# Patient Record
Sex: Female | Born: 1960 | Race: White | Hispanic: No | Marital: Single | Smoking: Former smoker
Health system: Southern US, Community
[De-identification: ages and names within clinical notes are randomized; demographics above are authoritative.]

## PROBLEM LIST (undated history)

## (undated) DIAGNOSIS — G4730 Sleep apnea, unspecified: Secondary | ICD-10-CM

## (undated) DIAGNOSIS — M545 Low back pain, unspecified: Secondary | ICD-10-CM

## (undated) DIAGNOSIS — F32A Depression, unspecified: Secondary | ICD-10-CM

## (undated) DIAGNOSIS — F909 Attention-deficit hyperactivity disorder, unspecified type: Secondary | ICD-10-CM

## (undated) DIAGNOSIS — D649 Anemia, unspecified: Secondary | ICD-10-CM

## (undated) DIAGNOSIS — Z8659 Personal history of other mental and behavioral disorders: Secondary | ICD-10-CM

## (undated) DIAGNOSIS — F411 Generalized anxiety disorder: Secondary | ICD-10-CM

## (undated) DIAGNOSIS — R519 Headache, unspecified: Secondary | ICD-10-CM

## (undated) DIAGNOSIS — K219 Gastro-esophageal reflux disease without esophagitis: Secondary | ICD-10-CM

## (undated) HISTORY — DX: Gastro-esophageal reflux disease without esophagitis: K21.9

## (undated) HISTORY — PX: COLONOSCOPY: SHX174

## (undated) HISTORY — DX: Low back pain, unspecified: M54.50

## (undated) HISTORY — DX: Attention-deficit hyperactivity disorder, unspecified type: F90.9

## (undated) HISTORY — DX: Low back pain: M54.5

## (undated) HISTORY — PX: WISDOM TOOTH EXTRACTION: SHX21

## (undated) HISTORY — DX: Generalized anxiety disorder: F41.1

---

## 1998-03-31 ENCOUNTER — Other Ambulatory Visit: Admission: RE | Admit: 1998-03-31 | Discharge: 1998-03-31 | Payer: Self-pay | Admitting: Obstetrics & Gynecology

## 1999-12-12 ENCOUNTER — Other Ambulatory Visit: Admission: RE | Admit: 1999-12-12 | Discharge: 1999-12-12 | Payer: Self-pay | Admitting: Obstetrics and Gynecology

## 2001-01-17 ENCOUNTER — Other Ambulatory Visit: Admission: RE | Admit: 2001-01-17 | Discharge: 2001-01-17 | Payer: Self-pay | Admitting: Obstetrics and Gynecology

## 2010-07-22 ENCOUNTER — Inpatient Hospital Stay (INDEPENDENT_AMBULATORY_CARE_PROVIDER_SITE_OTHER)
Admission: RE | Admit: 2010-07-22 | Discharge: 2010-07-22 | Disposition: A | Discharge status: Home / Self Care | Payer: BC Managed Care – PPO | Source: Ambulatory Visit | Attending: Family Medicine | Admitting: Family Medicine

## 2010-07-22 ENCOUNTER — Ambulatory Visit (HOSPITAL_COMMUNITY): Payer: Self-pay

## 2010-07-22 DIAGNOSIS — J069 Acute upper respiratory infection, unspecified: Secondary | ICD-10-CM

## 2012-11-04 ENCOUNTER — Other Ambulatory Visit: Payer: Self-pay | Admitting: Family Medicine

## 2012-11-04 DIAGNOSIS — Z1231 Encounter for screening mammogram for malignant neoplasm of breast: Secondary | ICD-10-CM

## 2012-11-06 ENCOUNTER — Ambulatory Visit
Admission: RE | Admit: 2012-11-06 | Discharge: 2012-11-06 | Disposition: A | Discharge status: Home / Self Care | Payer: BC Managed Care – PPO | Source: Ambulatory Visit | Attending: Family Medicine | Admitting: Family Medicine

## 2012-11-06 DIAGNOSIS — Z1231 Encounter for screening mammogram for malignant neoplasm of breast: Secondary | ICD-10-CM

## 2013-10-01 ENCOUNTER — Other Ambulatory Visit: Payer: Self-pay | Admitting: Family Medicine

## 2013-10-01 DIAGNOSIS — Z1231 Encounter for screening mammogram for malignant neoplasm of breast: Secondary | ICD-10-CM

## 2013-10-01 DIAGNOSIS — M1990 Unspecified osteoarthritis, unspecified site: Secondary | ICD-10-CM

## 2013-10-02 ENCOUNTER — Other Ambulatory Visit: Payer: Self-pay | Admitting: Family Medicine

## 2013-10-02 DIAGNOSIS — M8580 Other specified disorders of bone density and structure, unspecified site: Secondary | ICD-10-CM

## 2013-11-07 ENCOUNTER — Ambulatory Visit
Admission: RE | Admit: 2013-11-07 | Discharge: 2013-11-07 | Disposition: A | Discharge status: Home / Self Care | Payer: BC Managed Care – PPO | Source: Ambulatory Visit | Attending: Family Medicine | Admitting: Family Medicine

## 2013-11-07 DIAGNOSIS — Z1231 Encounter for screening mammogram for malignant neoplasm of breast: Secondary | ICD-10-CM

## 2013-11-07 DIAGNOSIS — M8580 Other specified disorders of bone density and structure, unspecified site: Secondary | ICD-10-CM

## 2016-10-08 ENCOUNTER — Encounter (HOSPITAL_COMMUNITY): Payer: Self-pay | Admitting: Emergency Medicine

## 2016-10-08 ENCOUNTER — Ambulatory Visit (HOSPITAL_COMMUNITY)
Admission: EM | Admit: 2016-10-08 | Discharge: 2016-10-08 | Disposition: A | Discharge status: Home / Self Care | Payer: BC Managed Care – PPO | Attending: Family Medicine | Admitting: Family Medicine

## 2016-10-08 DIAGNOSIS — S80861A Insect bite (nonvenomous), right lower leg, initial encounter: Secondary | ICD-10-CM

## 2016-10-08 DIAGNOSIS — W57XXXA Bitten or stung by nonvenomous insect and other nonvenomous arthropods, initial encounter: Secondary | ICD-10-CM

## 2016-10-08 NOTE — ED Triage Notes (Signed)
The patient presented to the [facility] with a complaint of a possible insect bite to the back of her right leg that she noticed today.

## 2016-10-09 NOTE — ED Provider Notes (Signed)
  [facility]   [id number] 10/08/16 Arrival Time: 1905  ASSESSMENT & PLAN:  Today you were diagnosed with the following: 1. Insect bite, initial encounter    Observation only.  Written information provided on tick bites.  If you are not improving over the next few days or feel you are worsening please follow up here or the Emergency Department if you are unable to see your regular doctor.   Reviewed expectations re: course of current medical issues. Questions answered. Outlined signs and symptoms indicating need for more acute intervention. Patient verbalized understanding. After Visit Summary given.   SUBJECTIVE:  Jade Cabrera is a 56 y.o. female who presents with complaint of possible insect bite to back of R leg. Small area of redness. Noticed this afternoon. Nonpainful. Has not enlarged. No drainage or bleeding. Afebrile. No abdominal pain or HA. No other rashes. Ambulatory.  ROS: As per HPI.   OBJECTIVE:  Vitals:   10/08/16 1932  BP: 110/64  Pulse: 86  Resp: 18  Temp: 98.2 F (36.8 C)  TempSrc: Oral  SpO2: 100%     General appearance: alert; no distress Extremities: no cyanosis or edema; symmetrical with no gross deformities Skin: warm and dry; post R thigh with 3 mm area of circular erythema; no significant induration; non-tender; no foreign body seen  No Known Allergies  PMHx, SurgHx, SocialHx, Medications, and Allergies were reviewed in the Visit Navigator and updated as appropriate.      Mardella LaymanHagler, Andrey Hoobler, MD 10/09/16 [phone]

## 2017-02-15 ENCOUNTER — Other Ambulatory Visit: Payer: Self-pay | Admitting: Family Medicine

## 2017-02-15 DIAGNOSIS — M810 Age-related osteoporosis without current pathological fracture: Secondary | ICD-10-CM

## 2017-02-15 DIAGNOSIS — Z1231 Encounter for screening mammogram for malignant neoplasm of breast: Secondary | ICD-10-CM

## 2017-08-28 ENCOUNTER — Encounter: Payer: Self-pay | Admitting: Neurology

## 2017-08-28 ENCOUNTER — Ambulatory Visit: Payer: BC Managed Care – PPO | Admitting: Neurology

## 2017-08-28 VITALS — BP 117/63 | HR 74 | Ht 64.0 in | Wt 140.0 lb

## 2017-08-28 DIAGNOSIS — R0681 Apnea, not elsewhere classified: Secondary | ICD-10-CM | POA: Diagnosis not present

## 2017-08-28 DIAGNOSIS — Z820 Family history of epilepsy and other diseases of the nervous system: Secondary | ICD-10-CM

## 2017-08-28 DIAGNOSIS — R0683 Snoring: Secondary | ICD-10-CM | POA: Diagnosis not present

## 2017-08-28 DIAGNOSIS — R400 Somnolence: Secondary | ICD-10-CM | POA: Diagnosis not present

## 2017-08-28 DIAGNOSIS — R351 Nocturia: Secondary | ICD-10-CM

## 2017-08-28 DIAGNOSIS — R51 Headache: Secondary | ICD-10-CM

## 2017-08-28 DIAGNOSIS — R519 Headache, unspecified: Secondary | ICD-10-CM

## 2017-08-28 NOTE — Patient Instructions (Signed)

## 2017-08-28 NOTE — Progress Notes (Signed)
Subjective:    Patient ID: Jade Cabrera is a 57 y.o. female.  HPI     Huston FolSHARAN MCENANEY [facility] [address] [address] [place], [place] [address]  Dear Jade Cabrera,   I saw your patient, Jade Cabrera, upon your kind request, in my neurologic clinic today for initial consultation of her sleep disorder, in particular, and for underlying obstructive sleep apnea. The patient is unaccompanied today. As you know, Jade Cabrera is a 57 year old right-handed woman with an underlying medical history of allergic rhinitis, anxiety, reflux disease and low back pain, who reports snoring and excessive daytime somnolence. I reviewed your office note from 07/04/2017, which you kindly included. Her husband has reported that she has breathing pauses while she is asleep. Her Epworth sleepiness score is 16 out of 24, fatigue score is 41 out of 63. She does not sleep well and does not wake up rested. She is married and lives with her husband her son and her mother-in-law. She has 2 children. She works for [employer], [employer], broadcasting/film/video. She quit smoking in the 1990s and does not currently utilize alcohol in any regularity. She drinks caffeine in the form of coffee, 2 cups in the morning typically. She has a family history of sleep apnea affecting her mother and her brother, both have a CPAP machine. She has been sleepy since this degree in the past year. Of note, she also restarted her Lexapro about a year ago. She was off of it for a year or 2 and had also stopped taking her Ritalin for ADD. She is no longer on Ritalin and is on Lexapro 5 mg strength once daily. She has been able to maintain her weight. In the past she was able to lose about 50 pounds and has been able to keep it off. Her bedtime is around 9, right time between 5:45 AM and late as 6:30 AM. She does have significant nocturia about 2-3 times per average night and has had the occasional morning headache, does no  longer suffer from migraines thankfully.she denies any palpitations symptoms of restless leg syndrome.  Her Past Medical History Is Significant For: Past Medical History:  Diagnosis Date  . ADHD   . GAD (generalized anxiety disorder)   . GERD without esophagitis   . Low back pain     Her Past Surgical History Is Significant For: No past surgical history on file.  Her Family History Is Significant For: No family history on file.  Her Social History Is Significant For: Social History   Socioeconomic History  . Marital status: Single    Spouse name: Not on file  . Number of children: Not on file  . Years of education: Not on file  . Highest education level: Not on file  Occupational History  . Not on file  Social Needs  . Financial resource strain: Not on file  . Food insecurity:    Worry: Not on file    Inability: Not on file  . Transportation needs:    Medical: Not on file    Non-medical: Not on file  Tobacco Use  . Smoking status: Never Smoker  . Smokeless tobacco: Never Used  Substance and Sexual Activity  . Alcohol use: No  . Drug use: No  . Sexual activity: Not on file  Lifestyle  . Physical activity:    Days per week: Not on file    Minutes per session: Not on file  . Stress: Not  on file  Relationships  . Social connections:    Talks on phone: Not on file    Gets together: Not on file    Attends religious service: Not on file    Active member of club or organization: Not on file    Attends meetings of clubs or organizations: Not on file    Relationship status: Not on file  Other Topics Concern  . Not on file  Social History Narrative  . Not on file    Her Allergies Are:  No Known Allergies:   Her Current Medications Are:  Outpatient Encounter Medications as of 08/28/2017  Medication Sig  . azelastine (ASTELIN) 0.1 % nasal spray Place 2 sprays into both nostrils 2 (two) times daily. Use in each nostril as directed  . escitalopram (LEXAPRO) 5 MG  tablet Take 5 mg by mouth daily.  Marland Kitchen loratadine (CLARITIN) 10 MG tablet Take 10 mg by mouth daily.   No facility-administered encounter medications on file as of 08/28/2017.   :  Review of Systems:  Out of a complete 14 point review of systems, all are reviewed and negative with the exception of these symptoms as listed below: Review of Systems  Neurological:       Pt presents today to discuss her sleep. Pt has never had a sleep study but does endorse snoring.  Epworth Sleepiness Scale 0= would never doze 1= slight chance of dozing 2= moderate chance of dozing 3= high chance of dozing  Sitting and reading: 3 Watching TV: 2 Sitting inactive in a public place (ex. Theater or meeting): 3 As a passenger in a car for an hour without a break: 1 Lying down to rest in the afternoon: 3 Sitting and talking to someone: 2 Sitting quietly after lunch (no alcohol): 2 In a car, while stopped in traffic: 0 Total: 16     Objective:  Neurological Exam  Physical Exam Physical Examination:   Vitals:   08/28/17 1559  BP: 117/63  Pulse: 74    General Examination: The patient is a very pleasant 57 y.o. female in no acute distress. She appears well-developed and well-nourished and well groomed.   HEENT: Normocephalic, atraumatic, pupils are equal, round and reactive to light and accommodation. Funduscopic exam is normal with sharp disc margins noted. Extraocular tracking is good without limitation to gaze excursion or nystagmus noted. Normal smooth pursuit is noted. Hearing is grossly intact. Tympanic membranes are clear bilaterally. Face is symmetric with normal facial animation and normal facial sensation. Speech is clear with no dysarthria noted. There is no hypophonia. There is no lip, neck/head, jaw or voice tremor. Neck is supple with full range of passive and active motion. There are no carotid bruits on auscultation. Oropharynx exam reveals: mild mouth dryness, adequate dental hygiene and  mild airway crowding, due to redundant soft palate. Mallampati is class II. Tongue protrudes centrally and palate elevates symmetrically. Tonsils are small. Neck size is 13 3/8 inches. She has a Moderate overbite.   Chest: Clear to auscultation without wheezing, rhonchi or crackles noted.  Heart: S1+S2+0, regular and normal without murmurs, rubs or gallops noted.   Abdomen: Soft, non-tender and non-distended with normal bowel sounds appreciated on auscultation.  Extremities: There is no pitting edema in the distal lower extremities bilaterally. Pedal pulses are intact.  Skin: Warm and dry without trophic changes noted.  Musculoskeletal: exam reveals no obvious joint deformities, tenderness or joint swelling or erythema.   Neurologically:  Mental status: The patient is  awake, alert and oriented in all 4 spheres. Her immediate and remote memory, attention, language skills and fund of knowledge are appropriate. There is no evidence of aphasia, agnosia, apraxia or anomia. Speech is clear with normal prosody and enunciation. Thought process is linear. Mood is normal and affect is normal.  Cranial nerves II - XII are as described above under HEENT exam. In addition: shoulder shrug is normal with equal shoulder height noted. Motor exam: Normal bulk, strength and tone is noted. There is no drift, tremor or rebound. Romberg is negative. Reflexes are 2+ throughout. Fine motor skills and coordination: intact with normal finger taps, normal hand movements, normal rapid alternating patting, normal foot taps and normal foot agility.  Cerebellar testing: No dysmetria or intention tremor. There is no truncal or gait ataxia.  Sensory exam: intact to light touch in the upper and lower extremities.  Gait, station and balance: She stands easily. No veering to one side is noted. No leaning to one side is noted. Posture is age-appropriate and stance is narrow based. Gait shows normal stride length and normal pace. No  problems turning are noted. Tandem walk is unremarkable.     Assessment and Plan:   In summary, FATOU DUNNIGAN is a very pleasant 57 y.o.-year old female with an underlying medical history of allergic rhinitis, anxiety, reflux disease and low back pain, whose history and physical exam concerning for obstructive sleep apnea (OSA). I had a long chat with the patient about my findings and the diagnosis of OSA, its prognosis and treatment options. We talked about medical treatments, surgical interventions and non-pharmacological approaches. I explained in particular the risks and ramifications of untreated moderate to severe OSA, especially with respect to developing cardiovascular disease down the Road, including congestive heart failure, difficult to treat hypertension, cardiac arrhythmias, or stroke. Even type 2 diabetes has, in part, been linked to untreated OSA. Symptoms of untreated OSA include daytime sleepiness, memory problems, mood irritability and mood disorder such as depression and anxiety, lack of energy, as well as recurrent headaches, especially morning headaches. We talked about trying to maintain a healthy lifestyle in general, as well as the importance of weight control. I encouraged the patient to eat healthy, exercise daily and keep well hydrated, to keep a scheduled bedtime and wake time routine, to not skip any meals and eat healthy snacks in between meals. I advised the patient not to drive when feeling sleepy. I recommended the following at this time: sleep study with potential positive airway pressure titration. (We will score hypopneas at 3%).   I explained the sleep test procedure to the patient and also outlined possible surgical and non-surgical treatment options of OSA, including the use of a custom-made dental device (which would require a referral to a specialist dentist or oral surgeon), upper airway surgical options, such as pillar implants, radiofrequency surgery, tongue base  surgery, and UPPP (which would involve a referral to an ENT surgeon). Rarely, jaw surgery such as mandibular advancement may be considered.  I also explained the CPAP treatment option to the patient, who indicated that she would be willing to try CPAP if the need arises. I explained the importance of being compliant with PAP treatment, not only for insurance purposes but primarily to improve Her symptoms, and for the patient's long term health benefit, including to reduce Her cardiovascular risks. I answered all her questions today and the patient was in agreement. I would like to see her back after the sleep study is  completed and encouraged her to call with any interim questions, concerns, problems or updates.   Thank you very much for allowing me to participate in the care of this nice patient. If I can be of any further assistance to you please do not hesitate to call me at [phone].  Sincerely,   Star Age, MD, PhD

## 2017-09-30 ENCOUNTER — Ambulatory Visit (INDEPENDENT_AMBULATORY_CARE_PROVIDER_SITE_OTHER): Payer: BC Managed Care – PPO | Admitting: Neurology

## 2017-09-30 DIAGNOSIS — Z820 Family history of epilepsy and other diseases of the nervous system: Secondary | ICD-10-CM

## 2017-09-30 DIAGNOSIS — R0683 Snoring: Secondary | ICD-10-CM

## 2017-09-30 DIAGNOSIS — R351 Nocturia: Secondary | ICD-10-CM

## 2017-09-30 DIAGNOSIS — R400 Somnolence: Secondary | ICD-10-CM

## 2017-09-30 DIAGNOSIS — R51 Headache: Secondary | ICD-10-CM

## 2017-09-30 DIAGNOSIS — R519 Headache, unspecified: Secondary | ICD-10-CM

## 2017-09-30 DIAGNOSIS — R0681 Apnea, not elsewhere classified: Secondary | ICD-10-CM

## 2017-09-30 DIAGNOSIS — G4720 Circadian rhythm sleep disorder, unspecified type: Secondary | ICD-10-CM

## 2017-09-30 DIAGNOSIS — G4733 Obstructive sleep apnea (adult) (pediatric): Secondary | ICD-10-CM | POA: Diagnosis not present

## 2017-10-09 ENCOUNTER — Telehealth: Payer: Self-pay

## 2017-10-09 NOTE — Progress Notes (Signed)
Patient referred by Dr. Duanne Guessewey, seen by me on 08/28/17, diagnostic PSG on 09/30/17.   Please call and notify the patient that the recent sleep study showed moderate to severe obstructive sleep apnea, with a total AHI of 14.6/hour, REM AHI of 44.4/hour, and O2 nadir of 74%. I recommend treatment for this in the form of CPAP. This will require a repeat sleep study for proper titration and mask fitting and correct monitoring of the oxygen saturations. Please explain to patient. I have placed an order in the chart. Thanks.  Huston FoleySaima Krosby Ritchie, MD, PhD [facility]

## 2017-10-09 NOTE — Telephone Encounter (Signed)
Pt returned my call. I advised pt that Dr. Athar reviewed their sleep study results and found that pt has moderate to severe osa and recommends that pt be treated with a cpap. Dr. Athar recommends that pt return for a repeat sleep study in order to properly titrate the cpap and ensure a good mask fit. Pt is agreeable to returning for a titration study. I advised pt that our sleep lab will file with pt's insurance and call pt to schedule the sleep study when we hear back from the pt's insurance regarding coverage of this sleep study. Pt verbalized understanding of results. Pt had no questions at this time but was encouraged to call back if questions arise.   

## 2017-10-09 NOTE — Telephone Encounter (Signed)
-----   Message from Huston FoleySaima Athar, MD sent at 10/09/2017  7:53 AM EDT ----- Patient referred by Dr. Duanne Guessewey, seen by me on 08/28/17, diagnostic PSG on 09/30/17.   Please call and notify the patient that the recent sleep study showed moderate to severe obstructive sleep apnea, with a total AHI of 14.6/hour, REM AHI of 44.4/hour, and O2 nadir of 74%. I recommend treatment for this in the form of CPAP. This will require a repeat sleep study for proper titration and mask fitting and correct monitoring of the oxygen saturations. Please explain to patient. I have placed an order in the chart. Thanks.  Huston FoleySaima Athar, MD, PhD [facility]

## 2017-10-09 NOTE — Addendum Note (Signed)
Addended by: Huston FoleyATHAR, Bonnie Roig on: 10/09/2017 07:54 AM   Modules accepted: Orders

## 2017-10-09 NOTE — Telephone Encounter (Signed)
I called pt to discuss her sleep study results, no answer, left a message asking her to call me back. 

## 2017-10-09 NOTE — Procedures (Signed)
PATIENT'S NAME:  Jade Cabrera, [name] DOB:      January 14, 1961      MR#:    [id number]     DATE OF RECORDING: 09/30/2017 REFERRING M.D.:  Maryelizabeth RowanElizabeth Dewey, MD Study Performed:   Baseline Polysomnogram HISTORY: 57 year old woman with a history of allergic rhinitis, anxiety, reflux disease and low back pain, who reports snoring and excessive daytime somnolence. Her Epworth sleepiness score is 16 out of 24. The patient's weight 141 pounds with a height of 64 (inches), resulting in a BMI of 24.1 kg/m2. The patient's neck circumference measured 13.5 inches.  CURRENT MEDICATIONS: Azelastine, Escitalopram and Loratadine.   PROCEDURE:  This is a multichannel digital polysomnogram utilizing the Somnostar 11.2 system.  Electrodes and sensors were applied and monitored per AASM Specifications.   EEG, EOG, Chin and Limb EMG, were sampled at 200 Hz.  ECG, Snore and Nasal Pressure, Thermal Airflow, Respiratory Effort, CPAP Flow and Pressure, Oximetry was sampled at 50 Hz. Digital video and audio were recorded.      BASELINE STUDY  Lights Out was at 22:26 and Lights On at 05:01.  Total recording time (TRT) was 396 minutes, with a total sleep time (TST) of  354.5 minutes.   The patient's sleep latency was 21 minutes. REM latency was 111.5 minutes.  The sleep efficiency was 89.5 %.     SLEEP ARCHITECTURE: WASO (Wake after sleep onset) was 20.5 minutes.  There were 6 minutes in Stage N1, 285 minutes Stage N2, 0 minutes Stage N3 and 63.5 minutes in Stage REM.  The percentage of Stage N1 was 1.7%, Stage N2 was 80.4%, which is markedly increased, Stage N3 was absent, and Stage R (REM sleep) was 17.9%. The arousals were noted as: 22 were spontaneous, 0 were associated with PLMs, 86 were associated with respiratory events.   RESPIRATORY ANALYSIS:  There were a total of 86 respiratory events:  22 obstructive apneas, 0 central apneas and 0 mixed apneas with a total of 22 apneas and an apnea index (AI) of 3.7 /hour. There were 64  hypopneas with a hypopnea index of 10.8 /hour. The patient also had 0 respiratory event related arousals (RERAs).      The total APNEA/HYPOPNEA INDEX (AHI) was 14.6/hour and the total RESPIRATORY DISTURBANCE INDEX was 0. 14.6 /hour.  47 events occurred in REM sleep and 46 events in NREM. The REM AHI was 44.4 /hour, versus a non-REM AHI of 8.. The patient spent 34.5 minutes of total sleep time in the supine position and 320 minutes in non-supine. The supine AHI was 3.5 versus a non-supine AHI of 15.7.  OXYGEN SATURATION & C02:  The Wake baseline 02 saturation was 94%, with the lowest being 74%. Time spent below 89% saturation equaled 48 minutes. PERIODIC LIMB MOVEMENTS: The patient had a total of 0 Periodic Limb Movements.  The Periodic Limb Movement (PLM) index was 0 and the PLM Arousal index was 0/hour.  Audio and video analysis did not show any abnormal or unusual movements, behaviors, phonations or vocalizations. The patient took 1 bathroom break. Mild to moderate snoring was noted. The EKG was in keeping with normal sinus rhythm (NSR).  Post-study, the patient indicated that sleep was better than usual.   IMPRESSION:  1. Obstructive Sleep Apnea (OSA) 2. Dysfunctions associated with sleep stages or arousals from sleep   RECOMMENDATIONS:  1. This study demonstrates moderate to severe obstructive sleep apnea, with a total AHI of 14.6/hour, REM AHI of 44.4/hour, and O2 nadir of 74%. Treatment  with positive airway pressure in the form of CPAP is recommended. This will require a full night titration study to optimize therapy. Other treatment options may include avoidance of supine sleep position along with weight loss, upper airway or jaw surgery in selected patients or the use of an oral appliance in certain patients. ENT evaluation and/or consultation with a maxillofacial surgeon or dentist may be feasible in some instances.    2. Please note that untreated obstructive sleep apnea may carry  additional perioperative morbidity. Patients with significant obstructive sleep apnea should receive perioperative PAP therapy and the surgeons and particularly the anesthesiologist should be informed of the diagnosis and the severity of the sleep disordered breathing. 3. This study shows abnormal sleep stage percentages; these are nonspecific findings and per se do not signify an intrinsic sleep disorder or a cause for the patient's sleep-related symptoms. Causes include (but are not limited to) the first night effect of the sleep study, circadian rhythm disturbances, medication effect or an underlying mood disorder or medical problem.  4. The patient should be cautioned not to drive, work at heights, or operate dangerous or heavy equipment when tired or sleepy. Review and reiteration of good sleep hygiene measures should be pursued with any patient. 5. The patient will be seen in follow-up in the sleep clinic at [facility] for discussion of the test results, symptom and treatment compliance review, further management strategies, etc. The referring provider will be notified of the test results.  I certify that I have reviewed the entire raw data recording prior to the issuance of this report in accordance with the Standards of Accreditation of the American Academy of Sleep Medicine (AASM)   Huston Foley, MD, PhD Diplomat, American Board of Neurology and Sleep Medicine (Neurology and Sleep Medicine)

## 2017-10-17 NOTE — Telephone Encounter (Signed)
Pt called in, asked to speak with me. I was able to take the call. She wanted to confirm that her O2 nadir was 74%. She also wanted to make sure Dr. Duanne Guessewey was sent a copy of the results. I advised her that I will re-fax the results again today, which I just completed. Pt has scheduled her titration study for tomorrow. Pt verbalized understanding of results.

## 2017-10-18 ENCOUNTER — Ambulatory Visit (INDEPENDENT_AMBULATORY_CARE_PROVIDER_SITE_OTHER): Payer: BC Managed Care – PPO | Admitting: Neurology

## 2017-10-18 DIAGNOSIS — G4720 Circadian rhythm sleep disorder, unspecified type: Secondary | ICD-10-CM

## 2017-10-18 DIAGNOSIS — R400 Somnolence: Secondary | ICD-10-CM

## 2017-10-18 DIAGNOSIS — G4733 Obstructive sleep apnea (adult) (pediatric): Secondary | ICD-10-CM

## 2017-10-18 DIAGNOSIS — R51 Headache: Secondary | ICD-10-CM

## 2017-10-18 DIAGNOSIS — R519 Headache, unspecified: Secondary | ICD-10-CM

## 2017-10-18 DIAGNOSIS — G4731 Primary central sleep apnea: Secondary | ICD-10-CM

## 2017-10-18 DIAGNOSIS — R351 Nocturia: Secondary | ICD-10-CM

## 2017-10-18 DIAGNOSIS — G4739 Other sleep apnea: Secondary | ICD-10-CM

## 2017-10-18 DIAGNOSIS — Z820 Family history of epilepsy and other diseases of the nervous system: Secondary | ICD-10-CM

## 2017-10-23 ENCOUNTER — Telehealth: Payer: Self-pay

## 2017-10-23 NOTE — Telephone Encounter (Signed)
I called pt. I advised pt that Dr. Frances FurbishAthar reviewed their sleep study results and found that pt did well with the bipap ST, after having central apnea and desats on cpap. Dr. Frances FurbishAthar recommends that pt start a bipap ST at home. I reviewed PAP compliance expectations with the pt. Pt is agreeable to starting a BiPAP. I advised pt that an order will be sent to a DME, [facility], and [facility] will call the pt within about one week after they file with the pt's insurance. [facility] will show the pt how to use the machine, fit for masks, and troubleshoot the BiPAP if needed. A follow up appt was made for insurance purposes with Dr. Frances FurbishAthar on 01/22/18 at 3:00pm. Pt verbalized understanding to arrive 15 minutes early and bring their BiPAP. A letter with all of this information in it will be mailed to the pt as a reminder. I verified with the pt that the address we have on file is correct. Pt verbalized understanding of results. Pt had no questions at this time but was encouraged to call back if questions arise.  Pt asked that I mail her a letter to [address], [place], [place] [id number].  Pt is discussing a possible ENT referral with Dr. Duanne Guessewey, pending her response to bipap.

## 2017-10-23 NOTE — Procedures (Signed)
S PATIENT'S NAME:  Jade Cabrera, Sable B DOB:      01/27/1961      MR#:    [id number]     DATE OF RECORDING: 10/18/2017 REFERRING M.D.:  Maryelizabeth RowanElizabeth Dewey, MD Study Performed:   CPAP  Titration HISTORY: 57 year old woman with allergic rhinitis, anxiety, reflux disease and low back pain, who presents for a full night PAP titration study. Her baseline PSG on 09/30/17 showed moderate to severe obstructive sleep apnea, with a total AHI of 14.6/hour, REM AHI of 44.4/hour, and O2 nadir of 74%. The patient endorsed the Epworth Sleepiness Scale at 16 points. The patient's weight 141 pounds with a height of 64 (inches), resulting in a BMI of 24.1 kg/m2. The patient's neck circumference measured 13.3 inches.  CURRENT MEDICATIONS: Azelastine, Escitalopram and Loratadine.  PROCEDURE:  This is a multichannel digital polysomnogram utilizing the SomnoStar 11.2 system.  Electrodes and sensors were applied and monitored per AASM Specifications.   EEG, EOG, Chin and Limb EMG, were sampled at 200 Hz.  ECG, Snore and Nasal Pressure, Thermal Airflow, Respiratory Effort, CPAP Flow and Pressure, Oximetry was sampled at 50 Hz. Digital video and audio were recorded.      The patient was fitted with a small Simplus FFM due to mouth venting. CPAP was initiated at 5 cmH20 with heated humidity per AASM standards and pressure was advanced to 7 cm, but the patient had significant CPAP emergent Central apneas. She was therefore switched to standard BiPAP at 10/5 cm, which failed to improve her central sleep apnea, AHI was 27.1/hour and O2 nadir was 878%. A back up rate was added at 10/min. At BiPAP ST at 10/5cmH20 and a rate of 10, her AHI was 0/hour with supine REM sleep achieved and O2 nadir of 88%.   Lights Out was at 22:40 and Lights On at 05:06. Total recording time (TRT) was 387 minutes, with a total sleep time (TST) of 342.5 minutes. The patient's sleep latency was 14.5 minutes. REM latency was 115 minutes.  The sleep efficiency was 88.5  %.    SLEEP ARCHITECTURE: WASO (Wake after sleep onset)  was 40.5 minutes with mild sleep fragmentation noted.  There were 8.5 minutes in Stage N1, 183 minutes Stage N2, 89.5 minutes Stage N3 and 61.5 minutes in Stage REM.  The percentage of Stage N1 was 2.5%, Stage N2 was 53.4%, Stage N3 was 26.1%, which is increased, and Stage R (REM sleep) was 18.%, which is near normal. The arousals were noted as: 53 were spontaneous, 0 were associated with PLMs, 15 were associated with respiratory events.  RESPIRATORY ANALYSIS:  There was a total of 50 respiratory events: 0 obstructive apneas, 50 central apneas and 0 mixed apneas with a total of 50 apneas and an apnea index (AI) of 8.8 /hour. There were 0 hypopneas with a hypopnea index of 0/hour. The patient also had 0 respiratory event related arousals (RERAs).      The total APNEA/HYPOPNEA INDEX  (AHI) was 8.8/hour and the total RESPIRATORY DISTURBANCE INDEX was 8.8 0./hour  1 events occurred in REM sleep and 49 events in NREM. The REM AHI was 1. /hour versus a non-REM AHI of 10.5 0./hour.  The patient spent 191.5 minutes of total sleep time in the supine position and 151 minutes in non-supine. The supine AHI was 9.4, versus a non-supine AHI of 7.9.  OXYGEN SATURATION & C02:  The baseline 02 saturation was 98%, with the lowest being 84%. Time spent below 89% saturation equaled 7 minutes.  PERIODIC LIMB MOVEMENTS:  The patient had a total of 0 Periodic Limb Movements. The Periodic Limb Movement (PLM) index was 0 and the PLM Arousal index was 0 /hour.  Audio and video analysis did not show any abnormal or unusual movements, behaviors, phonations or vocalizations. The patient took 1 bathroom break. The EKG was in keeping with normal sinus rhythm (NSR).  Post-study, the patient indicated that sleep was better than usual.   IMPRESSION: 1. Obstructive Sleep Apnea (OSA) 2. Treatment emergent central sleep apnea 3. Dysfunctions associated with sleep stages or  arousals from sleep   RECOMMENDATIONS: 1. This study demonstrates significant central apneas with desaturations on CPAP and standard BiPAP therapy. There was resolution of the patient's obstructive sleep apnea and central sleep apnea with BiPAP ST therapy. I will, therefore, start the patient on BiPAP ST at home at a pressure of 10/5 cm, with a back up rate of 10, via small full face mask with heated humidity. The patient should be reminded to be fully compliant with PAP therapy to improve sleep related symptoms and decrease long term cardiovascular risks. The patient should be reminded, that it may take up to 3 months to get fully used to using PAP with all planned sleep. The earlier full compliance is achieved, the better long term compliance tends to be. Please note that untreated obstructive sleep apnea may carry additional perioperative morbidity. Patients with significant obstructive sleep apnea should receive perioperative PAP therapy and the surgeons and particularly the anesthesiologist should be informed of the diagnosis and the severity of the sleep disordered breathing. 2. This study shows mild sleep fragmentation and mildly abnormal sleep stage percentages; these are nonspecific findings and per se do not signify an intrinsic sleep disorder or a cause for the patient's sleep-related symptoms. Causes include (but are not limited to) the first night effect of the sleep study, circadian rhythm disturbances, medication effect or an underlying mood disorder or medical problem.  3. The patient should be cautioned not to drive, work at heights, or operate dangerous or heavy equipment when tired or sleepy. Review and reiteration of good sleep hygiene measures should be pursued with any patient. 4. The patient will be seen in follow-up in the sleep clinic at [facility] for discussion of the test results, symptom and treatment compliance review, further management strategies, etc. The referring provider will be  notified of the test results.   I certify that I have reviewed the entire raw data recording prior to the issuance of this report in accordance with the Standards of Accreditation of the American Academy of Sleep Medicine (AASM)     Huston Foley, MD, PhD Diplomat, American Board of Neurology and Sleep Medicine (Neurology and Sleep Medicine)

## 2017-10-23 NOTE — Progress Notes (Signed)
Patient referred by Dr. Duanne Guessewey, seen by me on 08/28/17, diagnostic PSG on 09/30/17. Patient had a CPAP titration study on 10/18/17.  Please call and inform patient that I have entered an order for treatment with positive airway pressure (PAP) treatment for obstructive sleep apnea (OSA). She did well during the latest sleep study with BiPAP ST, had to switch from CPAP to BiPAP due to central sleep apneas with desats on CPAP only. We will, therefore, arrange for a machine for home use through a DME (durable medical equipment) company of Her choice; and I will see the patient back in follow-up in about 10 weeks. Please also explain to the patient that I will be looking out for compliance data, which can be downloaded from the machine (stored on an SD card, that is inserted in the machine) or via remote access through a modem, that is built into the machine. At the time of the followup appointment we will discuss sleep study results and how it is going with PAP treatment at home. Please advise patient to bring Her machine at the time of the first FU visit, even though this is cumbersome. Bringing the machine for every visit after that will likely not be needed, but often helps for the first visit to troubleshoot if needed. Please re-enforce the importance of compliance with treatment and the need for us to monitor compliance data - often an insurance requirement and actually good feedback for the patient as far as how they are doing.  Also remind patient, that any interim PAP machine or mask issues should be first addressed with the DME company, as they can often help better with technical and mask fit issues. Please ask if patient has a preference regarding DME company.  Please also make sure, the patient has a follow-up appointment with me in about 10 weeks from the setup date, thanks. May see one of our nurse practitioners if needed for proper timing of the FU appointment.  Please fax or rout report to the  referring provider. Thanks,   Huston FoleySaima Quinnton Bury, MD, PhD [facility])

## 2017-10-23 NOTE — Telephone Encounter (Signed)
I called pt to discuss her sleep study results. No answer, left a message asking her to call me back. 

## 2017-10-23 NOTE — Telephone Encounter (Signed)
Pt returning RN's call.

## 2017-10-23 NOTE — Telephone Encounter (Signed)
-----   Message from Huston FoleySaima Athar, MD sent at 10/23/2017  8:19 AM EDT ----- Patient referred by Dr. Duanne Guessewey, seen by me on 08/28/17, diagnostic PSG on 09/30/17. Patient had a CPAP titration study on 10/18/17.  Please call and inform patient that I have entered an order for treatment with positive airway pressure (PAP) treatment for obstructive sleep apnea (OSA). She did well during the latest sleep study with BiPAP ST, had to switch from CPAP to BiPAP due to central sleep apneas with desats on CPAP only. We will, therefore, arrange for a machine for home use through a DME (durable medical equipment) company of Her choice; and I will see the patient back in follow-up in about 10 weeks. Please also explain to the patient that I will be looking out for compliance data, which can be downloaded from the machine (stored on an SD card, that is inserted in the machine) or via remote access through a modem, that is built into the machine. At the time of the followup appointment we will discuss sleep study results and how it is going with PAP treatment at home. Please advise patient to bring Her machine at the time of the first FU visit, even though this is cumbersome. Bringing the machine for every visit after that will likely not be needed, but often helps for the first visit to troubleshoot if needed. Please re-enforce the importance of compliance with treatment and the need for us to monitor compliance data - often an insurance requirement and actually good feedback for the patient as far as how they are doing.  Also remind patient, that any interim PAP machine or mask issues should be first addressed with the DME company, as they can often help better with technical and mask fit issues. Please ask if patient has a preference regarding DME company.  Please also make sure, the patient has a follow-up appointment with me in about 10 weeks from the setup date, thanks. May see one of our nurse practitioners if needed for proper  timing of the FU appointment.  Please fax or rout report to the referring provider. Thanks,   Huston FoleySaima Athar, MD, PhD [facility])

## 2017-10-23 NOTE — Addendum Note (Signed)
Addended by: Huston FoleyATHAR, Myiah Petkus on: 10/23/2017 08:19 AM   Modules accepted: Orders

## 2017-10-23 NOTE — Progress Notes (Signed)
c 

## 2017-11-13 ENCOUNTER — Encounter: Payer: Self-pay | Admitting: Neurology

## 2017-11-14 ENCOUNTER — Telehealth: Payer: Self-pay | Admitting: Neurology

## 2017-11-14 DIAGNOSIS — G4733 Obstructive sleep apnea (adult) (pediatric): Secondary | ICD-10-CM

## 2017-11-14 DIAGNOSIS — G4731 Primary central sleep apnea: Secondary | ICD-10-CM

## 2017-11-14 NOTE — Addendum Note (Signed)
Addended by: Huston FoleyATHAR, Benisha Hadaway on: 11/14/2017 05:10 PM   Modules accepted: Orders

## 2017-11-14 NOTE — Telephone Encounter (Signed)
I reviewed her BiPAP compliance data from the past 9 days. Her average AHI is 110.5 per hour, BiPAP ST of 10/5 with a rate of 10. Although we do not have yet very many days to evaluate, I would suggest, we could try to increase the pressure slightly to 11/6. Please notify patient and fax order to DME.

## 2017-11-14 NOTE — Telephone Encounter (Signed)
Patient calling to discuss her CPAP's apnea events which is averaging 25 per hour. Is this normal?

## 2017-11-15 NOTE — Telephone Encounter (Signed)
I called pt and explained Dr. Teofilo PodAthar's recommendations. She is agreeable to the slight increase in pressure and will call us if she notices an average AHI of over 10 again.  I will send this order to [facility]. Pt verbalized understanding.

## 2017-11-15 NOTE — Telephone Encounter (Signed)
I called pt to discuss. No answer, left a message asking her to call me back. 

## 2018-01-02 ENCOUNTER — Encounter: Payer: Self-pay | Admitting: Neurology

## 2018-01-02 ENCOUNTER — Ambulatory Visit: Payer: BC Managed Care – PPO | Admitting: Adult Health

## 2018-01-02 ENCOUNTER — Encounter: Payer: Self-pay | Admitting: Adult Health

## 2018-01-02 VITALS — BP 104/64 | HR 84 | Ht 64.0 in | Wt 143.3 lb

## 2018-01-02 DIAGNOSIS — G4733 Obstructive sleep apnea (adult) (pediatric): Secondary | ICD-10-CM

## 2018-01-02 NOTE — Patient Instructions (Signed)
Your Plan:  Continue using CPAP nightly and >4 hours each night Increase pressure to 12/7 If your symptoms worsen or you develop new symptoms please let us know.    Thank you for coming to see [place] at [facility]. I hope we have been able to provide you high quality care today.  You may receive a patient satisfaction survey over the next few weeks. We would appreciate your feedback and comments so that we may continue to improve ourselves and the health of our patients.

## 2018-01-02 NOTE — Progress Notes (Addendum)
PATIENT: Jade Cabrera DOB: 1961/01/26  REASON FOR VISIT: follow up HISTORY FROM: patient  HISTORY OF PRESENT ILLNESS: Today 01/02/18: Jade Cabrera is a 57 year old female with a history of obstructive sleep apnea on BiPAP.  She returns today for follow-up.  Her download indicates that she use her machine 30 out of 30 days for compliance of 100%.  She use her machine greater than 4 hours 29 out of 30 days for compliance of 97%.  On average she is use her machine 6 hours and 40 minutes.  Her residual AHI is 6.6 on 11/6 centimeters of water with respiratory rate of 10.  She does not have a significant leak.  She reports that she is now getting a good night sleep using the BiPAP.  She states occasionally she will wake up in her jaw feels tight.  She is unsure what this is related to.  She returns today for evaluation.  HISTORY Ms. Walsworth is a 57 year old right-handed woman with an underlying medical history of allergic rhinitis, anxiety, reflux disease and low back pain, who reports snoring and excessive daytime somnolence. I reviewed your office note from 07/04/2017, which you kindly included. Her husband has reported that she has breathing pauses while she is asleep. Her Epworth sleepiness score is 16 out of 24, fatigue score is 41 out of 63. She does not sleep well and does not wake up rested. She is married and lives with her husband her son and her mother-in-law. She has 2 children. She works for [employer], [employer], broadcasting/film/video. She quit smoking in the 1990s and does not currently utilize alcohol in any regularity. She drinks caffeine in the form of coffee, 2 cups in the morning typically. She has a family history of sleep apnea affecting her mother and her brother, both have a CPAP machine. She has been sleepy since this degree in the past year. Of note, she also restarted her Lexapro about a year ago. She was off of it for a year or 2 and had also stopped taking her Ritalin for ADD. She is no  longer on Ritalin and is on Lexapro 5 mg strength once daily. She has been able to maintain her weight. In the past she was able to lose about 50 pounds and has been able to keep it off. Her bedtime is around 9, right time between 5:45 AM and late as 6:30 AM. She does have significant nocturia about 2-3 times per average night and has had the occasional morning headache, does no longer suffer from migraines thankfully.she denies any palpitations symptoms of restless leg syndrome.  REVIEW OF SYSTEMS: Out of a complete 14 system review of symptoms, the patient complains only of the following symptoms, and all other reviewed systems are negative.  Drooling, environmental allergies ESS 4  ALLERGIES: No Known Allergies  HOME MEDICATIONS: Outpatient Medications Prior to Visit  Medication Sig Dispense Refill  . azelastine (ASTELIN) 0.1 % nasal spray Place 2 sprays into both nostrils 2 (two) times daily. Use in each nostril as directed    . escitalopram (LEXAPRO) 5 MG tablet Take 5 mg by mouth daily.    Marland Kitchen loratadine (CLARITIN) 10 MG tablet Take 10 mg by mouth daily.     No facility-administered medications prior to visit.     PAST MEDICAL HISTORY: Past Medical History:  Diagnosis Date  . ADHD   . GAD (generalized anxiety disorder)   . GERD without esophagitis   . Low back pain  PAST SURGICAL HISTORY: History reviewed. No pertinent surgical history.  FAMILY HISTORY: History reviewed. No pertinent family history.  SOCIAL HISTORY: Social History   Socioeconomic History  . Marital status: Single    Spouse name: Not on file  . Number of children: Not on file  . Years of education: Not on file  . Highest education level: Not on file  Occupational History  . Not on file  Social Needs  . Financial resource strain: Not on file  . Food insecurity:    Worry: Not on file    Inability: Not on file  . Transportation needs:    Medical: Not on file    Non-medical: Not on file    Tobacco Use  . Smoking status: Never Smoker  . Smokeless tobacco: Never Used  Substance and Sexual Activity  . Alcohol use: No  . Drug use: No  . Sexual activity: Not on file  Lifestyle  . Physical activity:    Days per week: Not on file    Minutes per session: Not on file  . Stress: Not on file  Relationships  . Social connections:    Talks on phone: Not on file    Gets together: Not on file    Attends religious service: Not on file    Active member of club or organization: Not on file    Attends meetings of clubs or organizations: Not on file    Relationship status: Not on file  . Intimate partner violence:    Fear of current or ex partner: Not on file    Emotionally abused: Not on file    Physically abused: Not on file    Forced sexual activity: Not on file  Other Topics Concern  . Not on file  Social History Narrative  . Not on file      PHYSICAL EXAM  Vitals:   01/02/18 1316  BP: 104/64  Pulse: 84  Weight: 143 lb 4.8 oz (65 kg)  Height: 5\' 4"  (1.626 m)   Body mass index is 24.6 kg/m.  Generalized: Well developed, in no acute distress   Neurological examination  Mentation: Alert oriented to time, place, history taking. Follows all commands speech and language fluent Cranial nerve II-XII: Pupils were equal round reactive to light. Extraocular movements were full, visual field were full on confrontational test. Facial sensation and strength were normal. Uvula tongue midline. Head turning and shoulder shrug  were normal and symmetric.  Neck circumference 13 inches, Mallampati 4+ Motor: The motor testing reveals 5 over 5 strength of all 4 extremities. Good symmetric motor tone is noted throughout.  Sensory: Sensory testing is intact to soft touch on all 4 extremities. No evidence of extinction is noted.  Coordination: Cerebellar testing reveals good finger-nose-finger and heel-to-shin bilaterally.  Gait and station: Gait is normal.  Reflexes: Deep tendon  reflexes are symmetric and normal bilaterally.   DIAGNOSTIC DATA (LABS, IMAGING, TESTING) - I reviewed patient records, labs, notes, testing and imaging myself where available.     ASSESSMENT AND PLAN 57 y.o. year old female  has a past medical history of ADHD, GAD (generalized anxiety disorder), GERD without esophagitis, and Low back pain. here with:  1.  Obstructive sleep apnea on BiPAP  The patient download shows excellent compliance and good treatment of her apnea.  She is encouraged to begin using the BiPAP nightly and greater than 4 hours each night.  We will increase her pressure slightly to 12/7 to see if this offers  her any additional benefit.  If she is unable to tolerate the pressure change she will let us know.  She will follow-up in 6 months or sooner if needed.   I spent 15 minutes with the patient. 50% of this time was spent reviewing her BiPAP download   Butch Penny, MSN, NP-C 01/02/2018, 1:33 PM [facility] [address] [place], [place] [address] [phone]  I reviewed the above note and documentation by the Nurse Practitioner and agree with the history, physical exam, assessment and plan as outlined above. I was immediately available for face-to-face consultation. Huston Foley, MD, PhD [facility])

## 2018-01-22 ENCOUNTER — Ambulatory Visit: Payer: Self-pay | Admitting: Neurology

## 2018-07-04 ENCOUNTER — Ambulatory Visit: Payer: BC Managed Care – PPO | Admitting: Neurology

## 2018-10-01 ENCOUNTER — Ambulatory Visit
Admission: RE | Admit: 2018-10-01 | Discharge: 2018-10-01 | Disposition: A | Discharge status: Home / Self Care | Payer: Self-pay | Source: Ambulatory Visit | Attending: Family Medicine | Admitting: Family Medicine

## 2018-10-01 ENCOUNTER — Other Ambulatory Visit: Payer: Self-pay | Admitting: Family Medicine

## 2018-10-01 ENCOUNTER — Other Ambulatory Visit: Payer: Self-pay

## 2018-10-01 DIAGNOSIS — R52 Pain, unspecified: Secondary | ICD-10-CM

## 2018-10-18 ENCOUNTER — Ambulatory Visit
Admission: RE | Admit: 2018-10-18 | Discharge: 2018-10-18 | Disposition: A | Discharge status: Home / Self Care | Payer: BC Managed Care – PPO | Source: Ambulatory Visit | Attending: Family Medicine | Admitting: Family Medicine

## 2018-10-18 ENCOUNTER — Other Ambulatory Visit: Payer: Self-pay | Admitting: Family Medicine

## 2018-10-18 ENCOUNTER — Other Ambulatory Visit: Payer: Self-pay

## 2018-10-18 DIAGNOSIS — S92514A Nondisplaced fracture of proximal phalanx of right lesser toe(s), initial encounter for closed fracture: Secondary | ICD-10-CM

## 2018-12-10 ENCOUNTER — Other Ambulatory Visit: Payer: Self-pay | Admitting: Registered"

## 2018-12-10 DIAGNOSIS — Z20822 Contact with and (suspected) exposure to covid-19: Secondary | ICD-10-CM

## 2018-12-12 LAB — NOVEL CORONAVIRUS, NAA: SARS-CoV-2, NAA: NOT DETECTED

## 2019-01-15 ENCOUNTER — Other Ambulatory Visit: Payer: Self-pay | Admitting: Family Medicine

## 2019-01-15 DIAGNOSIS — Z1231 Encounter for screening mammogram for malignant neoplasm of breast: Secondary | ICD-10-CM

## 2019-03-05 ENCOUNTER — Ambulatory Visit: Payer: BC Managed Care – PPO

## 2019-03-06 ENCOUNTER — Ambulatory Visit
Admission: RE | Admit: 2019-03-06 | Discharge: 2019-03-06 | Disposition: A | Discharge status: Home / Self Care | Payer: BC Managed Care – PPO | Source: Ambulatory Visit | Attending: Family Medicine | Admitting: Family Medicine

## 2019-03-06 ENCOUNTER — Other Ambulatory Visit: Payer: Self-pay

## 2019-03-06 DIAGNOSIS — Z1231 Encounter for screening mammogram for malignant neoplasm of breast: Secondary | ICD-10-CM

## 2019-04-01 ENCOUNTER — Other Ambulatory Visit: Payer: Self-pay | Admitting: Family Medicine

## 2019-04-01 ENCOUNTER — Ambulatory Visit
Admission: RE | Admit: 2019-04-01 | Discharge: 2019-04-01 | Disposition: A | Discharge status: Home / Self Care | Payer: BC Managed Care – PPO | Source: Ambulatory Visit | Attending: Family Medicine | Admitting: Family Medicine

## 2019-04-01 DIAGNOSIS — M79644 Pain in right finger(s): Secondary | ICD-10-CM

## 2019-05-31 ENCOUNTER — Ambulatory Visit: Payer: BC Managed Care – PPO

## 2019-09-24 ENCOUNTER — Other Ambulatory Visit: Payer: Self-pay | Admitting: Neurosurgery

## 2019-10-02 NOTE — Pre-Procedure Instructions (Addendum)
[id number] [place], [place] - [address] AT [facility] [address] [facility] [facility] [address] [place] [address] Phone: [phone] Fax: [phone]      Your procedure is scheduled on Wednesday, July 14th.  Report to [facility] Main Entrance "A" at 7:30 A.M., and check in at the Admitting office.  Call this number if you have problems the morning of surgery:  [phone]  Call [phone] if you have any questions prior to your surgery date Monday-Friday 8am-4pm    Remember:  Do not eat or drink after midnight the night before your surgery.    Take these medicines the morning of surgery with A SIP OF WATER  escitalopram (LEXAPRO) loratadine (CLARITIN)  cyclobenzaprine (FLEXERIL) -as needed diphenhydrAMINE (BENADRYL) -as needed  As of today, STOP taking any Aspirin (unless otherwise instructed by your surgeon) Aleve, Naproxen, Ibuprofen, Motrin, Advil, Goody's, BC's, all herbal medications, fish oil, and all vitamins.                      Do not wear jewelry, make up, or nail polish            Do not wear lotions, powders, perfumes, or deodorant.            Do not shave 48 hours prior to surgery.              Do not bring valuables to the hospital.            [facility] is not responsible for any belongings or valuables.  Do NOT Smoke (Tobacco/Vaping) or drink Alcohol 24 hours prior to your procedure If you use a CPAP at night, you may bring all equipment for your overnight stay.   Contacts, glasses, dentures or bridgework may not be worn into surgery.      For patients admitted to the hospital, discharge time will be determined by your treatment team.   Patients discharged the day of surgery will not be allowed to drive home, and someone needs to stay with them for 24 hours.    Special instructions:   [facility]- Preparing For Surgery  Before surgery, you can play an important role. Because skin is not sterile, your skin needs to  be as free of germs as possible. You can reduce the number of germs on your skin by washing with CHG (chlorahexidine gluconate) Soap before surgery.  CHG is an antiseptic cleaner which kills germs and bonds with the skin to continue killing germs even after washing.    Oral Hygiene is also important to reduce your risk of infection.  Remember - BRUSH YOUR TEETH THE MORNING OF SURGERY WITH YOUR REGULAR TOOTHPASTE  Please do not use if you have an allergy to CHG or antibacterial soaps. If your skin becomes reddened/irritated stop using the CHG.  Do not shave (including legs and underarms) for at least 48 hours prior to first CHG shower. It is OK to shave your face.  Please follow these instructions carefully.   1. Shower the NIGHT BEFORE SURGERY and the MORNING OF SURGERY with CHG Soap.   2. If you chose to wash your hair, wash your hair first as usual with your normal shampoo.  3. After you shampoo, rinse your hair and body thoroughly to remove the shampoo.  4. Use CHG as you would any other liquid soap. You can apply CHG directly to the skin and wash gently with a scrungie or a  clean washcloth.   5. Apply the CHG Soap to your body ONLY FROM THE NECK DOWN.  Do not use on open wounds or open sores. Avoid contact with your eyes, ears, mouth and genitals (private parts). Wash Face and genitals (private parts)  with your normal soap.   6. Wash thoroughly, paying special attention to the area where your surgery will be performed.  7. Thoroughly rinse your body with warm water from the neck down.  8. DO NOT shower/wash with your normal soap after using and rinsing off the CHG Soap.  9. Pat yourself dry with a CLEAN TOWEL.  10. Wear CLEAN PAJAMAS to bed the night before surgery  11. Place CLEAN SHEETS on your bed the night of your first shower and DO NOT SLEEP WITH PETS.   Day of Surgery: Wear Clean/Comfortable clothing the morning of surgery Do not apply any deodorants/lotions.   Remember  to brush your teeth WITH YOUR REGULAR TOOTHPASTE.   Please read over the following fact sheets that you were given.

## 2019-10-03 ENCOUNTER — Other Ambulatory Visit: Payer: Self-pay

## 2019-10-03 ENCOUNTER — Encounter (HOSPITAL_COMMUNITY)
Admission: RE | Admit: 2019-10-03 | Discharge: 2019-10-03 | Disposition: A | Discharge status: Home / Self Care | Payer: BC Managed Care – PPO | Source: Ambulatory Visit | Attending: Neurosurgery | Admitting: Neurosurgery

## 2019-10-03 ENCOUNTER — Encounter (HOSPITAL_COMMUNITY): Payer: Self-pay

## 2019-10-03 DIAGNOSIS — Z01818 Encounter for other preprocedural examination: Secondary | ICD-10-CM | POA: Insufficient documentation

## 2019-10-03 HISTORY — DX: Anemia, unspecified: D64.9

## 2019-10-03 HISTORY — DX: Depression, unspecified: F32.A

## 2019-10-03 HISTORY — DX: Sleep apnea, unspecified: G47.30

## 2019-10-03 HISTORY — DX: Headache, unspecified: R51.9

## 2019-10-03 HISTORY — DX: Personal history of other mental and behavioral disorders: Z86.59

## 2019-10-03 LAB — CBC
HCT: 43.4 % (ref 36.0–46.0)
Hemoglobin: 13.4 g/dL (ref 12.0–15.0)
MCH: 27.7 pg (ref 26.0–34.0)
MCHC: 30.9 g/dL (ref 30.0–36.0)
MCV: 89.9 fL (ref 80.0–100.0)
Platelets: 339 10*3/uL (ref 150–400)
RBC: 4.83 MIL/uL (ref 3.87–5.11)
RDW: 12.8 % (ref 11.5–15.5)
WBC: 4.8 10*3/uL (ref 4.0–10.5)
nRBC: 0 % (ref 0.0–0.2)

## 2019-10-03 LAB — SURGICAL PCR SCREEN
MRSA, PCR: NEGATIVE
Staphylococcus aureus: NEGATIVE

## 2019-10-03 LAB — TYPE AND SCREEN
ABO/RH(D): A POS
Antibody Screen: NEGATIVE

## 2019-10-03 NOTE — Pre-Procedure Instructions (Signed)
Jade Cabrera  10/03/2019    Your procedure is scheduled on Wednesday, October 15, 2019 at 9:30 AM.   Report to [facility] Entrance "A" Admitting Office at 7:30 AM.   Call this number if you have problems the morning of surgery: [phone]   Questions prior to day of surgery, please call [phone] between 8 & 4 PM.   Remember:  Do not eat or drink after midnight Tuesday, 10/14/19.  Take these medicines the morning of surgery with A SIP OF WATER: Escitalopram (Lexapro), Loratadine (Claritin)  Do not use NSAIDS (Ibuprofen, Aleve, etc), Aspirin containing products, Multivitamins, Herbal medications or Fish Oil prior to surgery.     Do not wear jewelry, make-up or nail polish.  Do not wear lotions, powders, perfumes or deodorant.  Do not shave 48 hours prior to surgery.    Do not bring valuables to the hospital.  [facility] is not responsible for any belongings or valuables.  Contacts, dentures or bridgework may not be worn into surgery.  Leave your suitcase in the car.  After surgery it may be brought to your room.  For patients admitted to the hospital, discharge time will be determined by your treatment team.  [facility] - Preparing for Surgery  Before surgery, you can play an important role.  Because skin is not sterile, your skin needs to be as free of germs as possible.  You can reduce the number of germs on you skin by washing with CHG (chlorahexidine gluconate) soap before surgery.  CHG is an antiseptic cleaner which kills germs and bonds with the skin to continue killing germs even after washing.  Oral Hygiene is also important in reducing the risk of infection.  Remember to brush your teeth with your regular toothpaste the morning of surgery.  Please DO NOT use if you have an allergy to CHG or antibacterial soaps.  If your skin becomes reddened/irritated stop using the CHG and inform your nurse when you arrive at Short Stay.  Do not shave (including legs and  underarms) for at least 48 hours prior to the first CHG shower.  You may shave your face.  Please follow these instructions carefully:   1.  Shower with CHG Soap the night before surgery and the morning of Surgery.  2.  If you choose to wash your hair, wash your hair first as usual with your normal shampoo.  3.  After you shampoo, rinse your hair and body thoroughly to remove the shampoo. 4.  Use CHG as you would any other liquid soap.  You can apply chg directly to the skin and wash gently with a      scrungie or washcloth.           5.  Apply the CHG Soap to your body ONLY FROM THE NECK DOWN.   Do not use on open wounds or open sores. Avoid contact with your eyes, ears, mouth and genitals (private parts).  Wash genitals (private parts) with your normal soap - do this prior to using CHG soap.  6.  Wash thoroughly, paying special attention to the area where your surgery will be performed.  7.  Thoroughly rinse your body with warm water from the neck down.  8.  DO NOT shower/wash with your normal soap after using and rinsing off the CHG Soap.  9.  Pat yourself dry with a clean towel.            10.  Wear clean  pajamas.            11.  Place clean sheets on your bed the night of your first shower and do not sleep with pets.  Day of Surgery  Shower as above. Do not apply any lotions/deodorants the morning of surgery.   Please wear clean clothes to the hospital. Remember to brush your teeth with toothpaste.  Please read over the fact sheets that you were given.

## 2019-10-03 NOTE — Progress Notes (Signed)
PCP - Dr. Maryelizabeth Rowan  Chest x-ray -  EKG - today (pt stated that she's had a "few fluttery sensations" in her chest the past few weeks. Can't pinpoint anything in particular that triggers them.)  Sleep Study - 09/30/17 in Epic CPAP - yes, but has not used it the past 2-3 months due to chronic cough and feeling choked with Chiari malformation  COVID TEST- scheduled for 10/13/19.   Anesthesia review: Yes, pt has never been intubated. She states her mom had the same surgery and was told she had a narrow airway. Pt is getting choked on her saliva and has coughing spells that then cause severe headaches. Jade Poles, PA saw pt at PAT.  Patient denies shortness of breath, fever, cough and chest pain at PAT appointment   All instructions explained to the patient, with a verbal understanding of the material. Patient agrees to go over the instructions while at home for a better understanding. Patient also instructed to self quarantine after being tested for COVID-19. The opportunity to ask questions was provided.

## 2019-10-07 NOTE — Progress Notes (Signed)
Anesthesia Chart Review:  I spoke with the patient at her PAT appointment to address some anesthesia concerns she has.  She reports that her mother recently underwent similar surgery for Chiari malformation and has had prolonged postop complications with vocal fold injury felt secondary to intubation trauma.  Evidently her mother had a small airway at the intubation was difficult.  Patient has never had intubation but is worried she will have similar experience.  She said that she has had some mild difficulty swallowing recently, which is felt to be related to her Chiari malformation, and was part of the reason she went for work-up. She denies any difficulty breathing.  She denies any rapid changes in her symptoms.  I advised the patient that the anesthesiologist can use techniques such as video laryngoscope to help reduce chances of difficult intubation if they feel it is necessary.  She will discuss this with assigned anesthesiologist on day of surgery as well.    Patient also mentions some recent palpitations, described as brief fluttering in the chest. Typically last only few seconds. No other associated symptoms, no chest pain, no shortness of breath, no dizziness, no presyncope.  She is not limited in her ability to exercise.  She got multiple flights of stairs without stopping.   She feels that may be related to stress.  She has OSA but has not been using her CPAP for the past 2 to 3 months related to issues with Chiari malformation.  She has no history of CVD.  Overall, she is in very good health.  Her only chronic medications are Lexapro for depression and Claritin for seasonal allergies.  On exam lungs are CTAB and heart is regular rate and rhythm, no M/R/G.  EKG shows normal sinus rhythm with rate 68.  Preop labs reviewed, WNL.   Anticipate she can proceed as planned, will rediscuss intubation concerns with anesthesiologist on day of surgery.  EKG 10/03/2019: Normal sinus rhythm.  Rate 68. Low  voltage QRS. RSR' or QR pattern in V1 suggests right ventricular conduction delay.    Jade Cabrera [facility]/Anesthesiology Phone [phone] 10/07/2019 10:19 AM

## 2019-10-07 NOTE — Anesthesia Preprocedure Evaluation (Addendum)
Anesthesia Evaluation  Patient identified by MRN, date of birth, ID band Patient awake    Reviewed: Allergy & Precautions, NPO status , Patient's Chart, lab work & pertinent test results  Airway Mallampati: I   Neck ROM: Limited    Dental  (+) Teeth Intact, Dental Advisory Given   Pulmonary sleep apnea , former smoker,    breath sounds clear to auscultation       Cardiovascular negative cardio ROS   Rhythm:Regular Rate:Normal     Neuro/Psych  Headaches, PSYCHIATRIC DISORDERS Anxiety Depression    GI/Hepatic Neg liver ROS, GERD  ,  Endo/Other  negative endocrine ROS  Renal/GU negative Renal ROS     Musculoskeletal negative musculoskeletal ROS (+)   Abdominal Normal abdominal exam  (+)   Peds  Hematology negative hematology ROS (+)   Anesthesia Other Findings   Reproductive/Obstetrics                           Anesthesia Physical Anesthesia Plan  ASA: II  Anesthesia Plan: General   Post-op Pain Management:    Induction: Intravenous  PONV Risk Score and Plan: 4 or greater and Ondansetron, Dexamethasone, Midazolam and Scopolamine patch - Pre-op  Airway Management Planned: Oral ETT and Video Laryngoscope Planned  Additional Equipment: None  Intra-op Plan:   Post-operative Plan: Extubation in OR  Informed Consent: I have reviewed the patients History and Physical, chart, labs and discussed the procedure including the risks, benefits and alternatives for the proposed anesthesia with the patient or authorized representative who has indicated his/her understanding and acceptance.       Plan Discussed with: CRNA  Anesthesia Plan Comments: (See PAT note by Antionette Poles, PA-C  Glidescope intubation, patient positioning and maintaining position during induction/inubation was discussed in detail.   Remi gtt.  )     Anesthesia Quick Evaluation

## 2019-10-13 ENCOUNTER — Other Ambulatory Visit (HOSPITAL_COMMUNITY)
Admission: RE | Admit: 2019-10-13 | Discharge: 2019-10-13 | Disposition: A | Discharge status: Home / Self Care | Payer: BC Managed Care – PPO | Source: Ambulatory Visit | Attending: Neurosurgery | Admitting: Neurosurgery

## 2019-10-13 DIAGNOSIS — Z20822 Contact with and (suspected) exposure to covid-19: Secondary | ICD-10-CM | POA: Insufficient documentation

## 2019-10-13 LAB — SARS CORONAVIRUS 2 (TAT 6-24 HRS): SARS Coronavirus 2: NEGATIVE

## 2019-10-15 ENCOUNTER — Encounter (HOSPITAL_COMMUNITY)
Admission: RE | Disposition: A | Discharge status: Home / Self Care | Payer: Self-pay | Source: Home / Self Care | Attending: Neurosurgery

## 2019-10-15 ENCOUNTER — Inpatient Hospital Stay (HOSPITAL_COMMUNITY)
Admission: RE | Admit: 2019-10-15 | Discharge: 2019-10-19 | DRG: 027 | Disposition: A | Discharge status: Home Health | Payer: BC Managed Care – PPO | Attending: Neurosurgery | Admitting: Neurosurgery

## 2019-10-15 ENCOUNTER — Inpatient Hospital Stay (HOSPITAL_COMMUNITY): Payer: BC Managed Care – PPO

## 2019-10-15 ENCOUNTER — Other Ambulatory Visit: Payer: Self-pay

## 2019-10-15 ENCOUNTER — Encounter (HOSPITAL_COMMUNITY): Payer: Self-pay | Admitting: Neurosurgery

## 2019-10-15 ENCOUNTER — Inpatient Hospital Stay (HOSPITAL_COMMUNITY): Payer: BC Managed Care – PPO | Admitting: Physician Assistant

## 2019-10-15 DIAGNOSIS — M62838 Other muscle spasm: Secondary | ICD-10-CM | POA: Diagnosis present

## 2019-10-15 DIAGNOSIS — F909 Attention-deficit hyperactivity disorder, unspecified type: Secondary | ICD-10-CM | POA: Diagnosis present

## 2019-10-15 DIAGNOSIS — G935 Compression of brain: Principal | ICD-10-CM | POA: Diagnosis present

## 2019-10-15 DIAGNOSIS — K219 Gastro-esophageal reflux disease without esophagitis: Secondary | ICD-10-CM | POA: Diagnosis present

## 2019-10-15 DIAGNOSIS — Z20822 Contact with and (suspected) exposure to covid-19: Secondary | ICD-10-CM | POA: Diagnosis present

## 2019-10-15 DIAGNOSIS — R1310 Dysphagia, unspecified: Secondary | ICD-10-CM | POA: Diagnosis present

## 2019-10-15 DIAGNOSIS — Z87891 Personal history of nicotine dependence: Secondary | ICD-10-CM

## 2019-10-15 DIAGNOSIS — Z79899 Other long term (current) drug therapy: Secondary | ICD-10-CM | POA: Diagnosis not present

## 2019-10-15 DIAGNOSIS — G4730 Sleep apnea, unspecified: Secondary | ICD-10-CM | POA: Diagnosis present

## 2019-10-15 DIAGNOSIS — F411 Generalized anxiety disorder: Secondary | ICD-10-CM | POA: Diagnosis present

## 2019-10-15 DIAGNOSIS — F329 Major depressive disorder, single episode, unspecified: Secondary | ICD-10-CM | POA: Diagnosis present

## 2019-10-15 HISTORY — PX: SUBOCCIPITAL CRANIECTOMY CERVICAL LAMINECTOMY: SHX5404

## 2019-10-15 LAB — CBC
HCT: 37.2 % (ref 36.0–46.0)
Hemoglobin: 12 g/dL (ref 12.0–15.0)
MCH: 28.2 pg (ref 26.0–34.0)
MCHC: 32.3 g/dL (ref 30.0–36.0)
MCV: 87.3 fL (ref 80.0–100.0)
Platelets: 264 10*3/uL (ref 150–400)
RBC: 4.26 MIL/uL (ref 3.87–5.11)
RDW: 12.6 % (ref 11.5–15.5)
WBC: 4.8 10*3/uL (ref 4.0–10.5)
nRBC: 0 % (ref 0.0–0.2)

## 2019-10-15 LAB — MRSA PCR SCREENING: MRSA by PCR: NEGATIVE

## 2019-10-15 LAB — BASIC METABOLIC PANEL
Anion gap: 12 (ref 5–15)
BUN: 13 mg/dL (ref 6–20)
CO2: 24 mmol/L (ref 22–32)
Calcium: 8.9 mg/dL (ref 8.9–10.3)
Chloride: 104 mmol/L (ref 98–111)
Creatinine, Ser: 0.58 mg/dL (ref 0.44–1.00)
GFR calc Af Amer: >60 mL/min (ref >60)
GFR calc non Af Amer: >60 mL/min (ref >60)
Glucose, Bld: 138 mg/dL — ABNORMAL HIGH (ref 70–99)
Potassium: 3.8 mmol/L (ref 3.5–5.1)
Sodium: 140 mmol/L (ref 135–145)

## 2019-10-15 LAB — ABO/RH: ABO/RH(D): A POS

## 2019-10-15 SURGERY — SUBOCCIPITAL CRANIECTOMY CERVICAL LAMINECTOMY/DURAPLASTY
Anesthesia: General

## 2019-10-15 MED ORDER — HYDROMORPHONE HCL 1 MG/ML IJ SOLN
INTRAMUSCULAR | Status: AC
  Filled 2019-10-15: qty 0.5

## 2019-10-15 MED ORDER — CYCLOBENZAPRINE HCL 5 MG PO TABS
5.0000 mg | ORAL_TABLET | Freq: Every evening | ORAL | Status: DC | PRN
  Administered 2019-10-15: 10 mg via ORAL
  Filled 2019-10-15 (×2): qty 2

## 2019-10-15 MED ORDER — LORATADINE 10 MG PO TABS
10.0000 mg | ORAL_TABLET | Freq: Every day | ORAL | Status: DC
  Administered 2019-10-16 – 2019-10-19 (×4): 10 mg via ORAL
  Filled 2019-10-15 (×4): qty 1

## 2019-10-15 MED ORDER — THROMBIN 5000 UNITS EX SOLR
CUTANEOUS | Status: AC
  Filled 2019-10-15: qty 15000

## 2019-10-15 MED ORDER — CEFAZOLIN SODIUM-DEXTROSE 2-4 GM/100ML-% IV SOLN
2.0000 g | Freq: Three times a day (TID) | INTRAVENOUS | Status: AC
  Administered 2019-10-15 – 2019-10-16 (×2): 2 g via INTRAVENOUS
  Filled 2019-10-15 (×2): qty 100

## 2019-10-15 MED ORDER — LACTATED RINGERS IV SOLN: INTRAVENOUS | Status: DC | PRN

## 2019-10-15 MED ORDER — BACITRACIN ZINC 500 UNIT/GM EX OINT
TOPICAL_OINTMENT | CUTANEOUS | Status: AC
  Filled 2019-10-15: qty 28.35

## 2019-10-15 MED ORDER — CEFAZOLIN SODIUM-DEXTROSE 2-4 GM/100ML-% IV SOLN
2.0000 g | INTRAVENOUS | Status: AC
  Administered 2019-10-15: 2 g via INTRAVENOUS
  Filled 2019-10-15: qty 100

## 2019-10-15 MED ORDER — FENTANYL CITRATE (PF) 250 MCG/5ML IJ SOLN
INTRAMUSCULAR | Status: DC | PRN
  Administered 2019-10-15: 100 ug via INTRAVENOUS
  Administered 2019-10-15: 150 ug via INTRAVENOUS

## 2019-10-15 MED ORDER — LABETALOL HCL 5 MG/ML IV SOLN
INTRAVENOUS | Status: DC | PRN
  Administered 2019-10-15: 10 mg via INTRAVENOUS

## 2019-10-15 MED ORDER — PROPOFOL 10 MG/ML IV BOLUS
INTRAVENOUS | Status: DC | PRN
  Administered 2019-10-15: 170 mg via INTRAVENOUS

## 2019-10-15 MED ORDER — SUGAMMADEX SODIUM 200 MG/2ML IV SOLN
INTRAVENOUS | Status: DC | PRN
  Administered 2019-10-15: 200 mg via INTRAVENOUS

## 2019-10-15 MED ORDER — ESCITALOPRAM OXALATE 10 MG PO TABS
5.0000 mg | ORAL_TABLET | Freq: Every day | ORAL | Status: DC
  Administered 2019-10-16 – 2019-10-19 (×4): 5 mg via ORAL
  Filled 2019-10-15 (×5): qty 1

## 2019-10-15 MED ORDER — HYDROCODONE-ACETAMINOPHEN 5-325 MG PO TABS
1.0000 | ORAL_TABLET | ORAL | Status: DC | PRN
  Administered 2019-10-15 – 2019-10-16 (×4): 1 via ORAL
  Filled 2019-10-15 (×3): qty 1

## 2019-10-15 MED ORDER — THROMBIN 5000 UNITS EX SOLR
CUTANEOUS | Status: DC | PRN
  Administered 2019-10-15: 10000 [IU] via TOPICAL

## 2019-10-15 MED ORDER — PHENYLEPHRINE 40 MCG/ML (10ML) SYRINGE FOR IV PUSH (FOR BLOOD PRESSURE SUPPORT)
PREFILLED_SYRINGE | INTRAVENOUS | Status: DC | PRN
  Administered 2019-10-15: 80 ug via INTRAVENOUS
  Administered 2019-10-15: 40 ug via INTRAVENOUS
  Administered 2019-10-15: 80 ug via INTRAVENOUS
  Administered 2019-10-15: 120 ug via INTRAVENOUS
  Administered 2019-10-15 (×3): 80 ug via INTRAVENOUS

## 2019-10-15 MED ORDER — HYDROMORPHONE HCL 1 MG/ML IJ SOLN
INTRAMUSCULAR | Status: AC
  Filled 2019-10-15: qty 1

## 2019-10-15 MED ORDER — FENTANYL CITRATE (PF) 250 MCG/5ML IJ SOLN
INTRAMUSCULAR | Status: AC
  Filled 2019-10-15: qty 5

## 2019-10-15 MED ORDER — BUPIVACAINE HCL (PF) 0.25 % IJ SOLN
INTRAMUSCULAR | Status: AC
  Filled 2019-10-15: qty 30

## 2019-10-15 MED ORDER — CHLORHEXIDINE GLUCONATE CLOTH 2 % EX PADS
6.0000 | MEDICATED_PAD | Freq: Every day | CUTANEOUS | Status: DC
  Administered 2019-10-16 – 2019-10-19 (×3): 6 via TOPICAL

## 2019-10-15 MED ORDER — LIDOCAINE 2% (20 MG/ML) 5 ML SYRINGE
INTRAMUSCULAR | Status: DC | PRN
  Administered 2019-10-15: 40 mg via INTRAVENOUS

## 2019-10-15 MED ORDER — SODIUM CHLORIDE 0.9 % IV SOLN
INTRAVENOUS | Status: DC | PRN
  Administered 2019-10-15: 500 mL

## 2019-10-15 MED ORDER — HYDROCODONE-ACETAMINOPHEN 5-325 MG PO TABS
ORAL_TABLET | ORAL | Status: AC
  Filled 2019-10-15: qty 1

## 2019-10-15 MED ORDER — PROMETHAZINE HCL 25 MG PO TABS
12.5000 mg | ORAL_TABLET | ORAL | Status: DC | PRN
  Administered 2019-10-16: 12.5 mg via ORAL
  Administered 2019-10-16: 25 mg via ORAL
  Filled 2019-10-15 (×3): qty 2

## 2019-10-15 MED ORDER — HEMOSTATIC AGENTS (NO CHARGE) OPTIME
TOPICAL | Status: DC | PRN
  Administered 2019-10-15: 1 via TOPICAL

## 2019-10-15 MED ORDER — ONDANSETRON HCL 4 MG/2ML IJ SOLN
INTRAMUSCULAR | Status: AC
  Filled 2019-10-15: qty 2

## 2019-10-15 MED ORDER — ROCURONIUM BROMIDE 10 MG/ML (PF) SYRINGE
PREFILLED_SYRINGE | INTRAVENOUS | Status: AC
  Filled 2019-10-15: qty 10

## 2019-10-15 MED ORDER — HYDROMORPHONE HCL 1 MG/ML IJ SOLN
INTRAMUSCULAR | Status: AC
  Administered 2019-10-15: 1 mg via INTRAVENOUS
  Filled 2019-10-15: qty 1

## 2019-10-15 MED ORDER — DEXAMETHASONE SODIUM PHOSPHATE 10 MG/ML IJ SOLN
6.0000 mg | Freq: Four times a day (QID) | INTRAMUSCULAR | Status: AC
  Administered 2019-10-15 – 2019-10-16 (×4): 6 mg via INTRAVENOUS
  Filled 2019-10-15 (×4): qty 1

## 2019-10-15 MED ORDER — LIDOCAINE 2% (20 MG/ML) 5 ML SYRINGE
INTRAMUSCULAR | Status: AC
  Filled 2019-10-15: qty 5

## 2019-10-15 MED ORDER — ONDANSETRON HCL 4 MG/2ML IJ SOLN
INTRAMUSCULAR | Status: DC | PRN
  Administered 2019-10-15: 4 mg via INTRAVENOUS

## 2019-10-15 MED ORDER — PHENYLEPHRINE 40 MCG/ML (10ML) SYRINGE FOR IV PUSH (FOR BLOOD PRESSURE SUPPORT)
PREFILLED_SYRINGE | INTRAVENOUS | Status: AC
  Filled 2019-10-15: qty 10

## 2019-10-15 MED ORDER — DIPHENHYDRAMINE HCL 25 MG PO CAPS
25.0000 mg | ORAL_CAPSULE | Freq: Four times a day (QID) | ORAL | Status: DC | PRN
  Administered 2019-10-19: 25 mg via ORAL
  Filled 2019-10-15 (×2): qty 1

## 2019-10-15 MED ORDER — ONDANSETRON HCL 4 MG/2ML IJ SOLN
4.0000 mg | INTRAMUSCULAR | Status: DC | PRN
  Administered 2019-10-15 – 2019-10-16 (×4): 4 mg via INTRAVENOUS
  Filled 2019-10-15 (×4): qty 2

## 2019-10-15 MED ORDER — DEXAMETHASONE SODIUM PHOSPHATE 4 MG/ML IJ SOLN: 4.0000 mg | Freq: Three times a day (TID) | INTRAMUSCULAR | Status: DC

## 2019-10-15 MED ORDER — CHLORHEXIDINE GLUCONATE 0.12 % MT SOLN
15.0000 mL | Freq: Once | OROMUCOSAL | Status: AC
  Administered 2019-10-15: 15 mL via OROMUCOSAL
  Filled 2019-10-15: qty 15

## 2019-10-15 MED ORDER — BACITRACIN ZINC 500 UNIT/GM EX OINT
TOPICAL_OINTMENT | CUTANEOUS | Status: DC | PRN
  Administered 2019-10-15: 1 via TOPICAL

## 2019-10-15 MED ORDER — CHLORHEXIDINE GLUCONATE CLOTH 2 % EX PADS: 6.0000 | MEDICATED_PAD | Freq: Once | CUTANEOUS | Status: DC

## 2019-10-15 MED ORDER — LACTATED RINGERS IV SOLN: INTRAVENOUS | Status: DC

## 2019-10-15 MED ORDER — ACETAMINOPHEN 10 MG/ML IV SOLN
INTRAVENOUS | Status: AC
  Filled 2019-10-15: qty 100

## 2019-10-15 MED ORDER — SENNOSIDES-DOCUSATE SODIUM 8.6-50 MG PO TABS: 1.0000 | ORAL_TABLET | Freq: Every evening | ORAL | Status: DC | PRN

## 2019-10-15 MED ORDER — LIDOCAINE-EPINEPHRINE 1 %-1:100000 IJ SOLN
INTRAMUSCULAR | Status: DC | PRN
  Administered 2019-10-15: 10 mL

## 2019-10-15 MED ORDER — POTASSIUM CHLORIDE IN NACL 20-0.9 MEQ/L-% IV SOLN
INTRAVENOUS | Status: DC
  Filled 2019-10-15 (×7): qty 1000

## 2019-10-15 MED ORDER — MIDAZOLAM HCL 2 MG/2ML IJ SOLN
INTRAMUSCULAR | Status: DC | PRN
  Administered 2019-10-15: 2 mg via INTRAVENOUS

## 2019-10-15 MED ORDER — ROCURONIUM BROMIDE 10 MG/ML (PF) SYRINGE
PREFILLED_SYRINGE | INTRAVENOUS | Status: DC | PRN
  Administered 2019-10-15 (×2): 40 mg via INTRAVENOUS
  Administered 2019-10-15: 60 mg via INTRAVENOUS

## 2019-10-15 MED ORDER — LABETALOL HCL 5 MG/ML IV SOLN: 10.0000 mg | INTRAVENOUS | Status: DC | PRN

## 2019-10-15 MED ORDER — ORAL CARE MOUTH RINSE: 15.0000 mL | Freq: Once | OROMUCOSAL | Status: AC

## 2019-10-15 MED ORDER — ACETAMINOPHEN 10 MG/ML IV SOLN
INTRAVENOUS | Status: DC | PRN
Start: 2019-10-15 — End: 2019-10-15
  Administered 2019-10-15: 1000 mg via INTRAVENOUS

## 2019-10-15 MED ORDER — PANTOPRAZOLE SODIUM 40 MG IV SOLR
40.0000 mg | Freq: Every day | INTRAVENOUS | Status: DC
  Administered 2019-10-15 – 2019-10-18 (×4): 40 mg via INTRAVENOUS
  Filled 2019-10-15 (×4): qty 40

## 2019-10-15 MED ORDER — DEXAMETHASONE SODIUM PHOSPHATE 4 MG/ML IJ SOLN
4.0000 mg | Freq: Four times a day (QID) | INTRAMUSCULAR | Status: AC
  Administered 2019-10-16 – 2019-10-18 (×7): 4 mg via INTRAVENOUS
  Filled 2019-10-15 (×7): qty 1

## 2019-10-15 MED ORDER — PROPOFOL 10 MG/ML IV BOLUS
INTRAVENOUS | Status: AC
  Filled 2019-10-15: qty 40

## 2019-10-15 MED ORDER — DOCUSATE SODIUM 100 MG PO CAPS
100.0000 mg | ORAL_CAPSULE | Freq: Two times a day (BID) | ORAL | Status: DC
  Administered 2019-10-15 – 2019-10-16 (×2): 100 mg via ORAL
  Filled 2019-10-15 (×3): qty 1

## 2019-10-15 MED ORDER — LIDOCAINE-EPINEPHRINE 1 %-1:100000 IJ SOLN
INTRAMUSCULAR | Status: AC
  Filled 2019-10-15: qty 1

## 2019-10-15 MED ORDER — HYDROMORPHONE HCL 1 MG/ML IJ SOLN
INTRAMUSCULAR | Status: DC | PRN
  Administered 2019-10-15: .2 mg via INTRAVENOUS

## 2019-10-15 MED ORDER — ONDANSETRON HCL 4 MG PO TABS: 4.0000 mg | ORAL_TABLET | ORAL | Status: DC | PRN

## 2019-10-15 MED ORDER — MIDAZOLAM HCL 2 MG/2ML IJ SOLN
INTRAMUSCULAR | Status: AC
  Filled 2019-10-15: qty 2

## 2019-10-15 MED ORDER — SODIUM CHLORIDE 0.9 % IV SOLN
0.0125 ug/kg/min | INTRAVENOUS | Status: AC
  Administered 2019-10-15: .15 ug/kg/min via INTRAVENOUS
  Filled 2019-10-15: qty 2000

## 2019-10-15 MED ORDER — PHENYLEPHRINE HCL-NACL 10-0.9 MG/250ML-% IV SOLN
INTRAVENOUS | Status: DC | PRN
  Administered 2019-10-15: 25 ug/min via INTRAVENOUS

## 2019-10-15 MED ORDER — HYDROMORPHONE HCL 1 MG/ML IJ SOLN
0.5000 mg | INTRAMUSCULAR | Status: DC | PRN
  Administered 2019-10-15 (×2): 1 mg via INTRAVENOUS
  Administered 2019-10-16: 0.5 mg via INTRAVENOUS
  Filled 2019-10-15 (×2): qty 1

## 2019-10-15 MED ORDER — ALBUMIN HUMAN 5 % IV SOLN: INTRAVENOUS | Status: DC | PRN

## 2019-10-15 MED ORDER — DEXAMETHASONE SODIUM PHOSPHATE 10 MG/ML IJ SOLN
10.0000 mg | Freq: Once | INTRAMUSCULAR | Status: AC
  Administered 2019-10-15: 10 mg via INTRAVENOUS
  Filled 2019-10-15: qty 1

## 2019-10-15 SURGICAL SUPPLY — 69 items
ADH SKN CLS APL DERMABOND .7 (GAUZE/BANDAGES/DRESSINGS) ×1
APL SKNCLS STERI-STRIP NONHPOA (GAUZE/BANDAGES/DRESSINGS) ×1
BAG DECANTER FOR FLEXI CONT (MISCELLANEOUS) ×2 IMPLANT
BAND INSRT 18 STRL LF DISP RB (MISCELLANEOUS) ×2
BAND RUBBER #18 3X1/16 STRL (MISCELLANEOUS) ×2 IMPLANT
BENZOIN TINCTURE PRP APPL 2/3 (GAUZE/BANDAGES/DRESSINGS) ×1 IMPLANT
BLADE CLIPPER SURG (BLADE) ×4 IMPLANT
BLADE SURG 11 STRL SS (BLADE) ×2 IMPLANT
BLADE ULTRA TIP 2M (BLADE) IMPLANT
BUR ACORN 9.0 PRECISION (BURR) ×2 IMPLANT
CABLE BIPOLOR RESECTION CORD (MISCELLANEOUS) ×1 IMPLANT
CANISTER SUCT 3000ML PPV (MISCELLANEOUS) ×2 IMPLANT
CARTRIDGE OIL MAESTRO DRILL (MISCELLANEOUS) ×1 IMPLANT
CLIP VESOCCLUDE MED 6/CT (CLIP) ×1 IMPLANT
COVER WAND RF STERILE (DRAPES) ×1 IMPLANT
DERMABOND ADVANCED (GAUZE/BANDAGES/DRESSINGS) ×1
DERMABOND ADVANCED .7 DNX12 (GAUZE/BANDAGES/DRESSINGS) ×1 IMPLANT
DIFFUSER DRILL AIR PNEUMATIC (MISCELLANEOUS) ×2 IMPLANT
DRAPE LAPAROTOMY 100X72 PEDS (DRAPES) ×2 IMPLANT
DRAPE MICROSCOPE LEICA (MISCELLANEOUS) ×1 IMPLANT
DRAPE SURG 17X23 STRL (DRAPES) ×4 IMPLANT
DRAPE WARM FLUID 44X44 (DRAPES) ×2 IMPLANT
DRSG OPSITE POSTOP 4X8 (GAUZE/BANDAGES/DRESSINGS) ×1 IMPLANT
DURAGUARD 04CMX04CM ×1 IMPLANT
ELECT REM PT RETURN 9FT ADLT (ELECTROSURGICAL) ×2
ELECTRODE REM PT RTRN 9FT ADLT (ELECTROSURGICAL) ×1 IMPLANT
EVACUATOR 1/8 PVC DRAIN (DRAIN) IMPLANT
EVACUATOR SILICONE 100CC (DRAIN) IMPLANT
GAUZE 4X4 16PLY RFD (DISPOSABLE) IMPLANT
GAUZE SPONGE 4X4 12PLY STRL (GAUZE/BANDAGES/DRESSINGS) IMPLANT
GLOVE BIO SURGEON STRL SZ7 (GLOVE) ×1 IMPLANT
GLOVE BIO SURGEON STRL SZ7.5 (GLOVE) IMPLANT
GLOVE BIO SURGEON STRL SZ8 (GLOVE) ×2 IMPLANT
GLOVE BIOGEL PI IND STRL 7.0 (GLOVE) IMPLANT
GLOVE BIOGEL PI INDICATOR 7.0 (GLOVE) ×1
GLOVE EXAM NITRILE XL STR (GLOVE) IMPLANT
GLOVE INDICATOR 8.5 STRL (GLOVE) ×2 IMPLANT
GOWN STRL REUS W/ TWL LRG LVL3 (GOWN DISPOSABLE) ×1 IMPLANT
GOWN STRL REUS W/ TWL XL LVL3 (GOWN DISPOSABLE) ×1 IMPLANT
GOWN STRL REUS W/TWL 2XL LVL3 (GOWN DISPOSABLE) ×2 IMPLANT
GOWN STRL REUS W/TWL LRG LVL3 (GOWN DISPOSABLE) ×2
GOWN STRL REUS W/TWL XL LVL3 (GOWN DISPOSABLE) ×2
HEMOSTAT SURGICEL 2X14 (HEMOSTASIS) ×2 IMPLANT
KIT BASIN OR (CUSTOM PROCEDURE TRAY) ×2 IMPLANT
KIT TURNOVER KIT B (KITS) ×2 IMPLANT
NEEDLE HYPO 22GX1.5 SAFETY (NEEDLE) ×2 IMPLANT
NS IRRIG 1000ML POUR BTL (IV SOLUTION) ×2 IMPLANT
OIL CARTRIDGE MAESTRO DRILL (MISCELLANEOUS) ×2
PACK LAMINECTOMY NEURO (CUSTOM PROCEDURE TRAY) ×2 IMPLANT
PAD ARMBOARD 7.5X6 YLW CONV (MISCELLANEOUS) ×6 IMPLANT
PATTIES SURGICAL .5 X3 (DISPOSABLE) ×1 IMPLANT
PATTIES SURGICAL 1/4 X 3 (GAUZE/BANDAGES/DRESSINGS) IMPLANT
SPONGE LAP 4X18 RFD (DISPOSABLE) IMPLANT
SPONGE SURGIFOAM ABS GEL SZ50 (HEMOSTASIS) ×2 IMPLANT
STAPLER SKIN PROX WIDE 3.9 (STAPLE) ×2 IMPLANT
STRIP CLOSURE SKIN 1/2X4 (GAUZE/BANDAGES/DRESSINGS) ×1 IMPLANT
SUT ETHILON 2 0 FS 18 (SUTURE) IMPLANT
SUT ETHILON 3 0 FSL (SUTURE) ×2 IMPLANT
SUT NURALON 4 0 TR CR/8 (SUTURE) ×4 IMPLANT
SUT PROLENE 6 0 BV (SUTURE) IMPLANT
SUT VIC AB 1 CT1 18XBRD ANBCTR (SUTURE) ×1 IMPLANT
SUT VIC AB 1 CT1 8-18 (SUTURE) ×4
SUT VIC AB 2-0 CT1 18 (SUTURE) ×2 IMPLANT
SUT VIC AB 3-0 SH 8-18 (SUTURE) IMPLANT
TOWEL GREEN STERILE (TOWEL DISPOSABLE) ×2 IMPLANT
TOWEL GREEN STERILE FF (TOWEL DISPOSABLE) ×1 IMPLANT
TRAY FOLEY MTR SLVR 16FR STAT (SET/KITS/TRAYS/PACK) IMPLANT
UNDERPAD 30X36 HEAVY ABSORB (UNDERPADS AND DIAPERS) IMPLANT
WATER STERILE IRR 1000ML POUR (IV SOLUTION) ×2 IMPLANT

## 2019-10-15 NOTE — Anesthesia Postprocedure Evaluation (Signed)
Anesthesia Post Note  Patient: AISHWARYA SHIPLETT  Procedure(s) Performed: Chiari Decompression and Cervical One laminectomy (N/A )     Patient location during evaluation: PACU Anesthesia Type: General Level of consciousness: awake and alert Pain management: pain level controlled Vital Signs Assessment: post-procedure vital signs reviewed and stable Respiratory status: spontaneous breathing, nonlabored ventilation, respiratory function stable and patient connected to nasal cannula oxygen Cardiovascular status: blood pressure returned to baseline and stable Postop Assessment: no apparent nausea or vomiting Anesthetic complications: no   No complications documented.  Last Vitals:  Vitals:   10/15/19 1545 10/15/19 1615  BP: (!) 111/58 119/64  Pulse: 92 93  Resp: (!) 27 17  Temp:    SpO2: 92% 95%    Last Pain:  Vitals:   10/15/19 1545  PainSc: Asleep                 Shelton Silvas

## 2019-10-15 NOTE — Anesthesia Procedure Notes (Signed)
Procedure Name: Intubation Date/Time: 10/15/2019 10:20 AM Performed by: Verdie Drown, CRNA Pre-anesthesia Checklist: Patient identified, Emergency Drugs available, Suction available and Patient being monitored Patient Re-evaluated:Patient Re-evaluated prior to induction Oxygen Delivery Method: Circle System Utilized Preoxygenation: Pre-oxygenation with 100% oxygen Induction Type: IV induction Ventilation: Mask ventilation without difficulty Laryngoscope Size: Mac, 3 and Glidescope Grade View: Grade I Tube type: Oral Tube size: 7.0 mm Number of attempts: 1 Airway Equipment and Method: Stylet and Video-laryngoscopy Placement Confirmation: ETT inserted through vocal cords under direct vision,  positive ETCO2 and breath sounds checked- equal and bilateral Secured at: 20 cm Tube secured with: Tape Dental Injury: Teeth and Oropharynx as per pre-operative assessment  Difficulty Due To: Difficulty was anticipated and Difficult Airway- due to limited oral opening Comments: Head/neck remain in midline and neutral position of comfort per pt prior to induction.

## 2019-10-15 NOTE — Op Note (Signed)
Preoperative diagnosis: Chiari I malformation  Postoperative diagnosis: Same  Procedure: Suboccipital craniectomy for decompression posterior fossa with C1 laminectomy for Chiari malformation with duraplasty with bovine pericardium  Surgeon: Jillyn Hidden Kelli Egolf  Assistant: Hoyt Koch  Anesthesia: General  EBL: Minimal  HPI: 59 year old female is a longstanding chronic headaches also some swallowing difficulty some numbness in her face and arm and hand work-up revealed tonsillar distention down to below the level of C1 lamina with significant crowding of the cervical medullary junction.  Due to patient's progression of clinical syndrome imaging findings failed conservative treatment I recommended suboccipital craniectomy for decompression of Chiari with C1 laminectomy.  I extensively went over the risks and benefits of the operation with her as well as perioperative course expectations of outcome and alternatives of surgery and she understands and agrees to proceed forward.  Operative procedure: Brought into the OR was due to general anesthesia positioned prone in pins and neck in slight flexion the backside of her head neck was shaved prepped and draped in routine sterile fashion.  After infiltration of 10 cc lidocaine with epi midline incision was made expose the suboccipital C1 and C2 lamina dissected around the foramen magnum and utilizing high-speed drill drilled bilateral craniectomy is over the cerebellar hemispheres extending down inferiorly opened up the foramen magnum widely as well as performed a C1 laminectomy down to the level of the top of the C2 lamina.  Then opened up the dura in a Y-shaped fashion contact the dura back immediately identified the arachnoid tonsillar distention then dissected the arachnoid and carefully dissected between the cerebellar tonsils lifting the Mobic and visualize directly the cord plexus of the obex of the fourth ventricle.  Confirmed adequate egress of spinal  fluid from that location freed up all adhesions around the cerebellar tonsils then selected a 4 x 4 bovine pericardium dural patch and sewed it with running Nurolon's.  Create a watertight seal I tested with Valsalva.  Then I placed the DuraSeal green glue around the suture margin some Gelfoam and some additional green glue then closed the wound tightly with interrupted Vicryls and a running nylon.  Wound was dressed patient recovery in stable addition.  At the end the case all needle count sponge counts were correct.

## 2019-10-15 NOTE — H&P (Signed)
Jade Cabrera is an 59 y.o. female.   Chief Complaint: Headaches swallowing difficulty HPI: 59 year old female is a longstanding chronic headaches and some difficulty swallowing with work-up has revealed a Chiari I malformation with severe compression of the foramen magnum with tonsillar herniation below C1.  Due to patient's progressive clinical syndrome imaging findings failed conservative treatment I recommended suboccipital craniectomy C1 laminectomy for Chiari decompression.  I have extensively gone over the risks and benefits of the operation with her as well as perioperative course expectations of outcome and alternatives of surgery and she understands and agrees to proceed forward.  Past Medical History:  Diagnosis Date  . ADHD   . Anemia    borderline  . Depression   . GAD (generalized anxiety disorder)   . GERD without esophagitis   . Headache    headaches with Chiari malformation, migraines during teenage years and pregnancy  . History of bulimia   . Low back pain   . Sleep apnea    has a cpap but has not been using the past 2-3 months due to the Chiari malformation (10/03/19)    Past Surgical History:  Procedure Laterality Date  . COLONOSCOPY    . WISDOM TOOTH EXTRACTION      History reviewed. No pertinent family history. Social History:  reports that she has quit smoking. She has never used smokeless tobacco. She reports previous alcohol use. She reports that she does not use drugs.  Allergies: No Known Allergies  Medications Prior to Admission  Medication Sig Dispense Refill  . cyclobenzaprine (FLEXERIL) 5 MG tablet Take 5-10 mg by mouth at bedtime as needed for spasms.    . diphenhydrAMINE (BENADRYL) 25 MG tablet Take 25 mg by mouth every 6 (six) hours as needed (allergies.).    Marland Kitchen escitalopram (LEXAPRO) 5 MG tablet Take 5 mg by mouth daily.    Marland Kitchen loratadine (CLARITIN) 10 MG tablet Take 10 mg by mouth daily.    . NON FORMULARY Apply topically at bedtime as needed.  Apply to wart with applicator and cover with tape at bedtime.  Remove tape the next morning.      Results for orders placed or performed during the hospital encounter of 10/15/19 (from the past 48 hour(s))  ABO/Rh     Status: None   Collection Time: 10/15/19  8:43 AM  Result Value Ref Range   ABO/RH(D)      A POS Performed at [facility], [address], [place], [place] [address]    No results found.  Review of Systems  Musculoskeletal: Positive for neck stiffness.  Neurological: Positive for headaches.    Blood pressure 114/66, pulse 79, temperature 97.8 F (36.6 C), resp. rate 20, height 5\' 4"  (1.626 m), weight 66.6 kg, SpO2 96 %. Physical Exam HENT:     Head: Normocephalic.     Right Ear: Tympanic membrane normal.     Nose: Nose normal.  Eyes:     Pupils: Pupils are equal, round, and reactive to light.  Cardiovascular:     Rate and Rhythm: Normal rate.  Pulmonary:     Effort: Pulmonary effort is normal.  Abdominal:     General: Abdomen is flat.  Musculoskeletal:        General: Normal range of motion.  Neurological:     Mental Status: She is alert.     Comments: Patient is awake and alert pupils are equal extraocular is intact cranial nerves are intact strength is 5 out of  5      Assessment/Plan 59 year old presents for suboccipital decompression craniotomy with C1 laminectomy for Chiari decompression  Mariam Dollar, MD 10/15/2019, 9:59 AM

## 2019-10-15 NOTE — Anesthesia Procedure Notes (Signed)
Arterial Line Insertion Start/End7/14/2021 10:18 AM, 10/15/2019 10:21 AM Performed by: Carmela Rima, CRNA, CRNA  Patient location: OR. Preanesthetic checklist: patient identified, IV checked, site marked, risks and benefits discussed, surgical consent, monitors and equipment checked, pre-op evaluation, timeout performed and anesthesia consent Patient sedated Right, radial was placed Catheter size: 20 G Hand hygiene performed , maximum sterile barriers used  and Seldinger technique used  Attempts: 1 Procedure performed without using ultrasound guided technique. Following insertion, Biopatch and dressing applied. Post procedure assessment: normal  Patient tolerated the procedure well with no immediate complications.

## 2019-10-15 NOTE — Transfer of Care (Signed)
Immediate Anesthesia Transfer of Care Note  Patient: Jade Cabrera  Procedure(s) Performed: Chiari Decompression and Cervical One laminectomy (N/A )  Patient Location: PACU  Anesthesia Type:General  Level of Consciousness: awake, alert  and oriented  Airway & Oxygen Therapy: Patient Spontanous Breathing  Post-op Assessment: Report given to RN and Post -op Vital signs reviewed and stable  Post vital signs: Reviewed and stable  Last Vitals:  Vitals Value Taken Time  BP 125/59 10/15/19 1400  Temp 36.9 C 10/15/19 1400  Pulse 85 10/15/19 1406  Resp 14 10/15/19 1406  SpO2 99 % 10/15/19 1406  Vitals shown include unvalidated device data.  Last Pain:  Vitals:   10/15/19 1400  PainSc: 0-No pain         Complications: No complications documented.

## 2019-10-16 ENCOUNTER — Encounter (HOSPITAL_COMMUNITY): Payer: Self-pay | Admitting: Neurosurgery

## 2019-10-16 LAB — BASIC METABOLIC PANEL
Anion gap: 9 (ref 5–15)
BUN: 14 mg/dL (ref 6–20)
CO2: 25 mmol/L (ref 22–32)
Calcium: 8.8 mg/dL — ABNORMAL LOW (ref 8.9–10.3)
Chloride: 103 mmol/L (ref 98–111)
Creatinine, Ser: 0.68 mg/dL (ref 0.44–1.00)
GFR calc Af Amer: >60 mL/min (ref >60)
GFR calc non Af Amer: >60 mL/min (ref >60)
Glucose, Bld: 153 mg/dL — ABNORMAL HIGH (ref 70–99)
Potassium: 4.3 mmol/L (ref 3.5–5.1)
Sodium: 137 mmol/L (ref 135–145)

## 2019-10-16 MED ORDER — OXYCODONE HCL 5 MG PO TABS
10.0000 mg | ORAL_TABLET | ORAL | Status: DC | PRN
  Administered 2019-10-16 – 2019-10-19 (×12): 10 mg via ORAL
  Filled 2019-10-16 (×13): qty 2

## 2019-10-16 MED ORDER — DIAZEPAM 5 MG PO TABS
5.0000 mg | ORAL_TABLET | Freq: Four times a day (QID) | ORAL | Status: DC | PRN
  Administered 2019-10-16 – 2019-10-18 (×7): 5 mg via ORAL
  Filled 2019-10-16 (×7): qty 1

## 2019-10-16 MED ORDER — DOCUSATE SODIUM 50 MG/5ML PO LIQD
100.0000 mg | Freq: Two times a day (BID) | ORAL | Status: DC
  Administered 2019-10-16 – 2019-10-19 (×6): 100 mg via ORAL
  Filled 2019-10-16 (×6): qty 10

## 2019-10-16 NOTE — Progress Notes (Signed)
Subjective: Patient reports doing great...noted improved swallowing last pm but sliightly worse this am...muscle spasm and headaches  Objective: Vital signs in last 24 hours: Temp:  [97.8 F (36.6 C)-98.9 F (37.2 C)] 98.4 F (36.9 C) (07/15 0400) Pulse Rate:  [79-110] 95 (07/15 0700) Resp:  [8-30] 12 (07/15 0700) BP: (111-131)/(53-72) 128/65 (07/15 0700) SpO2:  [92 %-99 %] 97 % (07/15 0700) Arterial Line BP: (122-157)/(44-64) 133/58 (07/14 2000) Weight:  [66.6 kg-68.6 kg] 68.6 kg (07/14 2000)  Intake/Output from previous day: 07/14 0701 - 07/15 0700 In: 2483.8 [I.V.:1933.8; IV Piggyback:550] Out: 1905 [Urine:1655; Blood:250] Intake/Output this shift: No intake/output data recorded.  alert and oriented, intact  Lab Results: Recent Labs    10/15/19 1404  WBC 4.8  HGB 12.0  HCT 37.2  PLT 264   BMET Recent Labs    10/15/19 1404 10/16/19 0510  NA 140 137  K 3.8 4.3  CL 104 103  CO2 24 25  GLUCOSE 138* 153*  BUN 13 14  CREATININE 0.58 0.68  CALCIUM 8.9 8.8*    Studies/Results: No results found.  Assessment/Plan: POD 1 Chiari decompression.  speech eval for dysphagia, change meds add valium, continue steroids.  LOS: 1 day     Mariam Dollar 10/16/2019, 7:35 AM

## 2019-10-16 NOTE — Evaluation (Signed)
Clinical/Bedside Swallow Evaluation Patient Details  Name: SHAQUEL JOSEPHSON MRN: [id number] Date of Birth: [birth year]  Today's Date: 10/16/2019 Time: SLP Start Time (ACUTE ONLY): 0900 SLP Stop Time (ACUTE ONLY): 0931 SLP Time Calculation (min) (ACUTE ONLY): 31 min  Past Medical History:  Past Medical History:  Diagnosis Date  . ADHD   . Anemia    borderline  . Depression   . GAD (generalized anxiety disorder)   . GERD without esophagitis   . Headache    headaches with Chiari malformation, migraines during teenage years and pregnancy  . History of bulimia   . Low back pain   . Sleep apnea    has a cpap but has not been using the past 2-3 months due to the Chiari malformation (10/03/19)   Past Surgical History:  Past Surgical History:  Procedure Laterality Date  . COLONOSCOPY    . WISDOM TOOTH EXTRACTION     HPI:  Pt is a 59 year old female with progressive clinical symptoms including headaches and difficulty swallowing, presenting for suboccipital decompression craniotomy with C1 laminectomy for Chiari decompression 7/14. PMH also includes: sleep apnea, GERD, GAD, depression, ADHD   Assessment / Plan / Recommendation Clinical Impression  Pt has mild generalized oral weakness but with functional appearing oral preparation. She describes increased coughing especially over the last 30 days and primarily with thin liquids. Coughing is noted during this evaluation with thin liquids and ice chips as well, with suspected reduced hyolaryngeal movement to palpation. Yankauer was introduced for pt to self-manage expectoration of small amount of thin liquid vs secretions. She clinically appears to swallow solids and nectar thick liquids without overt s/s of aspiration, and subjectively she feels like she can swallow them "much better." For today, recommend starting with a mechanical soft diet and nectar thick liquids. Given that pt had improved swallow funciton last night post-operatively, I'm  hopeful for good prognosis for diet advancement with additional time.  SLP Visit Diagnosis: Dysphagia, unspecified (R13.10)    Aspiration Risk  Mild aspiration risk;Moderate aspiration risk    Diet Recommendation Dysphagia 3 (Mech soft);Nectar-thick liquid   Liquid Administration via: Cup;Straw Medication Administration: Crushed with puree Supervision: Staff to assist with self feeding Compensations: Slow rate;Small sips/bites Postural Changes: Seated upright at 90 degrees    Other  Recommendations Oral Care Recommendations: Oral care BID Other Recommendations: Order thickener from pharmacy;Prohibited food (jello, ice cream, thin soups);Remove water pitcher   Follow up Recommendations  (tba)      Frequency and Duration min 2x/week  2 weeks       Prognosis Prognosis for Safe Diet Advancement: Good      Swallow Study   General HPI: Pt is a 59 year old female with progressive clinical symptoms including headaches and difficulty swallowing, presenting for suboccipital decompression craniotomy with C1 laminectomy for Chiari decompression 7/14. PMH also includes: sleep apnea, GERD, GAD, depression, ADHD Type of Study: Bedside Swallow Evaluation Previous Swallow Assessment: none in chart Diet Prior to this Study: NPO Temperature Spikes Noted: No Respiratory Status: Room air History of Recent Intubation:  (for procedure only 7/15) Behavior/Cognition: Lethargic/Drowsy;Cooperative;Pleasant mood Oral Cavity Assessment: Within Functional Limits Oral Care Completed by SLP: No Oral Cavity - Dentition: Adequate natural dentition Vision: Functional for self-feeding Self-Feeding Abilities: Needs assist Patient Positioning: Upright in bed Baseline Vocal Quality: Low vocal intensity (mild) Volitional Cough: Strong Volitional Swallow: Able to elicit    Oral/Motor/Sensory Function Overall Oral Motor/Sensory Function: Generalized oral weakness   Ice Chips Ice chips:  Impaired Presentation:  Spoon Pharyngeal Phase Impairments: Throat Clearing - Immediate   Thin Liquid Thin Liquid: Impaired Presentation: Spoon Pharyngeal  Phase Impairments: Throat Clearing - Immediate;Cough - Immediate    Nectar Thick Nectar Thick Liquid: Impaired Presentation: Straw Pharyngeal Phase Impairments: Decreased hyoid-laryngeal movement   Honey Thick Honey Thick Liquid: Not tested   Puree Puree: Impaired Presentation: Spoon Pharyngeal Phase Impairments: Decreased hyoid-laryngeal movement   Solid     Solid: Impaired Pharyngeal Phase Impairments: Decreased hyoid-laryngeal movement      Mahala Menghini., M.A. CCC-SLP Acute Rehabilitation Services Pager [phone] Office [phone]  10/16/2019,9:36 AM

## 2019-10-17 ENCOUNTER — Inpatient Hospital Stay (HOSPITAL_COMMUNITY): Payer: BC Managed Care – PPO

## 2019-10-17 MED ORDER — RESOURCE THICKENUP CLEAR PO POWD
ORAL | Status: DC | PRN
  Filled 2019-10-17: qty 125

## 2019-10-17 NOTE — Progress Notes (Signed)
  Speech Language Pathology Treatment: Dysphagia  Patient Details Name: Jade Cabrera MRN: [id number] DOB: 1960/12/31 Today's Date: 10/17/2019 Time: 0935-1000 SLP Time Calculation (min) (ACUTE ONLY): 25 min  Assessment / Plan / Recommendation Clinical Impression  Pt is more alert and conversational today, reporting feeling better overall. She and her husband believe that her swallowing has improved, although not to its baseline. SLP provided advanced trials of thin liquids which primarily elicited throat clearing today, although also with an intermittent cough and pt feeling like she needs to swallow a second time to clear her throat. Of note, pt's husband also says that there is a h/o more chronic cough with pt and her family. She has less s/s of dysphagia when consuming solids and nectar thick liquids - similar to previous date. Recommend proceeding with MBS while pt remains inpatient to better assess oropharyngeal swallow. Pt and spouse are already thinking ahead about discharge planning and I think this will also help with education for safety at home. All questions answered as able at this time.    HPI HPI: Pt is a 59 year old female with progressive clinical symptoms including headaches and difficulty swallowing, presenting for suboccipital decompression craniotomy with C1 laminectomy for Chiari decompression 7/14. PMH also includes: sleep apnea, GERD, GAD, depression, ADHD      SLP Plan  MBS       Recommendations  Diet recommendations: Dysphagia 3 (mechanical soft);Nectar-thick liquid Liquids provided via: Cup;Straw Medication Administration: Crushed with puree Supervision: Patient able to self feed;Intermittent supervision to cue for compensatory strategies Compensations: Slow rate;Small sips/bites Postural Changes and/or Swallow Maneuvers: Seated upright 90 degrees                Oral Care Recommendations: Oral care BID Follow up Recommendations: Home health SLP;Outpatient  SLP SLP Visit Diagnosis: Dysphagia, unspecified (R13.10) Plan: MBS       GO                Mahala Menghini., M.A. CCC-SLP Acute Rehabilitation Services Pager [phone] Office [phone]  10/17/2019, 10:16 AM

## 2019-10-17 NOTE — Progress Notes (Signed)
Subjective: Patient reports Patient doing well improved headache and neck pain  Objective: Vital signs in last 24 hours: Temp:  [98.4 F (36.9 C)-98.7 F (37.1 C)] 98.4 F (36.9 C) (07/16 0400) Pulse Rate:  [73-103] 74 (07/16 0700) Resp:  [6-22] 15 (07/16 0700) BP: (113-139)/(65-77) 130/69 (07/16 0700) SpO2:  [95 %-100 %] 100 % (07/16 0700)  Intake/Output from previous day: 07/15 0701 - 07/16 0700 In: 1705.9 [I.V.:1705.9] Out: 350 [Urine:350] Intake/Output this shift: No intake/output data recorded.  Awake alert oriented moves all extremities well incision clean dry and intact  Lab Results: Recent Labs    10/15/19 1404  WBC 4.8  HGB 12.0  HCT 37.2  PLT 264   BMET Recent Labs    10/15/19 1404 10/16/19 0510  NA 140 137  K 3.8 4.3  CL 104 103  CO2 24 25  GLUCOSE 138* 153*  BUN 13 14  CREATININE 0.58 0.68  CALCIUM 8.9 8.8*    Studies/Results: No results found.  Assessment/Plan: Postop day 2 suboccipital craniectomy and C1 laminectomy for Chiari decompression doing very well will mobilize today with physical Occupational Therapy transfer to 4 N. progressive we will have case worker evaluate for home health needs.  LOS: 2 days     Mariam Dollar 10/17/2019, 7:50 AM

## 2019-10-17 NOTE — Evaluation (Signed)
Occupational Therapy Evaluation Patient Details Name: Jade Cabrera MRN: [id number] DOB: 1961-01-04 Today's Date: 10/17/2019    History of Present Illness Pt is a 59 y/o female with longstanding chronic headaches and some difficulty swallowing.  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 S/P Chiari decompression 10/15/19.    Clinical Impression   PTA patient independent with ADLs, mobility using cane.  Admitted for above and limited by problem list below, including pain, impaired balance, decreased activity tolerance.  Patient completing bed mobility with min guard assist, transfers with min guard assist, in room mobility with min guard to min assist (1-2 HHA for patient preference/comfort), ADLs with min assist.  Educated on cervical precautions for comfort and pain mgmt, ADL compensatory techniques, safety, DME and recommendations. Patient progressing well, believe she will benefit from further OT services while admitted but anticipate no further OT needs after dc home.     Follow Up Recommendations  No OT follow up;Supervision/Assistance - 24 hour    Equipment Recommendations  None recommended by OT    Recommendations for Other Services PT consult     Precautions / Restrictions Precautions Precautions: Fall Precaution Comments: cervical precautions utilized for comfort  Restrictions Weight Bearing Restrictions: No      Mobility Bed Mobility Overal bed mobility: Needs Assistance Bed Mobility: Rolling;Sidelying to Sit Rolling: Min guard Sidelying to sit: Min guard       General bed mobility comments: increased time and effort, cueing for technique but no physical assist required  Transfers Overall transfer level: Needs assistance Equipment used: 1 person hand held assist;2 person hand held assist Transfers: Sit to/from Stand Sit to Stand: Min guard         General transfer comment: min guard to  steady     Balance Overall balance assessment: Needs assistance Sitting-balance support: No upper extremity supported;Feet supported Sitting balance-Leahy Scale: Good     Standing balance support: Bilateral upper extremity supported;During functional activity;Single extremity supported Standing balance-Leahy Scale: Fair Standing balance comment: pt with preference to UE support                            ADL either performed or assessed with clinical judgement   ADL Overall ADL's : Needs assistance/impaired     Grooming: Minimal assistance;Standing;Oral care   Upper Body Bathing: Set up;Supervision/ safety;Sitting   Lower Body Bathing: Minimal assistance;Sit to/from stand   Upper Body Dressing : Minimal assistance;Sitting   Lower Body Dressing: Minimal assistance;Sit to/from stand;Cueing for compensatory techniques Lower Body Dressing Details (indicate cue type and reason): using figure 4 technique, min guard sit to stand but relies on BUE support  Toilet Transfer: Ambulation;Min guard Toilet Transfer Details (indicate cue type and reason): hand held assist, used grab bar Toileting- Architect and Hygiene: Min guard;Sit to/from stand       Functional mobility during ADLs: Min guard General ADL Comments: pt limited by pain, impaired balance, decreased activity tolerance; pt guarded with mobility      Vision Baseline Vision/History: Wears glasses Wears Glasses: At all times Patient Visual Report: No change from baseline Vision Assessment?: No apparent visual deficits     Perception     Praxis      Pertinent Vitals/Pain Pain Assessment: Faces Faces Pain Scale: Hurts little more Pain Location: incisional- neck/head Pain Descriptors / Indicators: Grimacing;Discomfort;Operative site guarding Pain Intervention(s): Limited activity within patient's tolerance;Monitored during session;Repositioned  Hand Dominance Right   Extremity/Trunk  Assessment Upper Extremity Assessment Upper Extremity Assessment: Overall WFL for tasks assessed (limited shoulder flexion to 90 for comfort )   Lower Extremity Assessment Lower Extremity Assessment: Defer to PT evaluation   Cervical / Trunk Assessment Cervical / Trunk Assessment: Other exceptions Cervical / Trunk Exceptions: s/p surgery    Communication Communication Communication: No difficulties   Cognition Arousal/Alertness: Awake/alert Behavior During Therapy: WFL for tasks assessed/performed Overall Cognitive Status: Within Functional Limits for tasks assessed                                     General Comments  guarded with mobility- patient initally requested 2 person hand held assist and progressed to 1 person hand held assist with encouragement     Exercises     Shoulder Instructions      Home Living Family/patient expects to be discharged to:: Private residence Living Arrangements: Spouse/significant other;Parent;Children Available Help at Discharge: Family;Available 24 hours/day Type of Home: House       Home Layout: Two level;Able to live on main level with bedroom/bathroom     Bathroom Shower/Tub: Producer, television/film/video: Standard     Home Equipment: Toilet riser;Shower seat;Cane - single point          Prior Functioning/Environment Level of Independence: Independent        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 PTA but otherwise independent          OT Problem List: Decreased strength;Decreased activity tolerance;Impaired balance (sitting and/or standing);Pain;Decreased knowledge of precautions;Decreased knowledge of use of DME or AE      OT Treatment/Interventions: Self-care/ADL training;Balance training;Therapeutic exercise;DME and/or AE instruction;Therapeutic activities;Patient/family education    OT Goals(Current goals can be found in the care plan section) Acute Rehab OT  Goals Patient Stated Goal: home and to get better OT Goal Formulation: With patient Time For Goal Achievement: 10/31/19 Potential to Achieve Goals: Good ADL Goals Pt Will Perform Grooming: Independently;standing Pt Will Perform Lower Body Bathing: sit to/from stand;sitting/lateral leans;with supervision Pt Will Perform Lower Body Dressing: with supervision;sit to/from stand;sitting/lateral leans Pt Will Transfer to Toilet: with supervision;ambulating;regular height toilet;bedside commode Pt Will Perform Tub/Shower Transfer: with supervision;shower seat;ambulating  OT Frequency: Min 2X/week   Barriers to D/C:            Co-evaluation              AM-PAC OT "6 Clicks" Daily Activity     Outcome Measure Help from another person eating meals?: A Little Help from another person taking care of personal grooming?: A Little Help from another person toileting, which includes using toliet, bedpan, or urinal?: A Little Help from another person bathing (including washing, rinsing, drying)?: A Little Help from another person to put on and taking off regular upper body clothing?: A Little Help from another person to put on and taking off regular lower body clothing?: A Little 6 Click Score: 18   End of Session Equipment Utilized During Treatment: Gait belt Nurse Communication: Mobility status  Activity Tolerance: Patient tolerated treatment well Patient left: in chair;with call bell/phone within reach;with family/visitor present  OT Visit Diagnosis: Other abnormalities of gait and mobility (R26.89);Pain Pain - part of body:  (head/neck- incisional)                Time: 4098-1191 OT Time Calculation (  min): 35 min Charges:  OT General Charges $OT Visit: 1 Visit OT Evaluation $OT Eval Moderate Complexity: 1 Mod OT Treatments $Self Care/Home Management : 8-22 mins  Barry Brunner, OT Acute Rehabilitation Services Pager [phone] Office [phone]   Chancy Milroy 10/17/2019, 11:09 AM

## 2019-10-17 NOTE — Progress Notes (Signed)
Modified Barium Swallow Progress Note  Patient Details  Name: Jade Cabrera MRN: [id number] Date of Birth: 10-Nov-1960  Today's Date: 10/17/2019  Modified Barium Swallow completed.  Full report located under Chart Review in the Imaging Section.  Brief recommendations include the following:  Clinical Impression   Pt presents with a mild pharyngeal dysphagia characterized primarily by reduced epiglottic inversion, anterior hyoid movement, laryngeal vestibule closure, and UES opening. She has mild residue in the pyriform sinuses with thin liquids, which is associate with most of the throat clearing noted. She also performs a spontaneous second swallow that clears this residue efficiently. A chin tuck does not facilitate pharyngeal clearance and actually results in trace aspiration. She also had trace aspiration that occurred when she was drinking consecutive straw sips and with mixed consistency bolus. Almost every time that aspiration occurred, a spontaneous throat clear was sufficient to clear her airway. There was a single instance in which one was not spontaneously elicited, but a cued throat clear was just as effective. Pt has improved function with thickened liquids and solids. Recommend advancing diet to regular solids and thin liquids with use of single sips, intermittent throat clear, and avoiding mixed consistencies such as pills with thin liquids. Given that pt has had some fluctuating symptoms, would recommend sending her home with her thickener, which could be used at times when she does not feel she is swallowing as well. Also recommend [facility] SLP f/u to maximize swallowing safety.   Swallow Evaluation Recommendations       SLP Diet Recommendations: Regular solids;Thin liquid   Liquid Administration via: Cup;Straw   Medication Administration: Whole meds with puree   Supervision: Patient able to self feed;Intermittent supervision to cue for compensatory strategies   Compensations: Slow  rate;Small sips/bites;Clear throat intermittently;Other (Comment) (single sips)   Postural Changes: Seated upright at 90 degrees   Oral Care Recommendations: Oral care BID        Mahala Menghini., M.A. CCC-SLP Acute Rehabilitation Services Pager [phone] Office [phone]  10/17/2019,2:11 PM

## 2019-10-17 NOTE — Evaluation (Signed)
Physical Therapy Evaluation Patient Details Name: Jade Cabrera MRN: [id number] DOB: 07-18-1960 Today's Date: 10/17/2019   History of Present Illness  Pt is a 59 y/o female with longstanding chronic headaches and some difficulty swallowing.  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 S/P Chiari decompression 10/15/19.   Clinical Impression  Pt presents to PT with deficits in functional mobility, gait, balance, endurance, power, and cervical ROM. Pt is unsteady with ambulation and requires UE support to improve stability, still with one LOB during session. Pt will benefit from gait training with use of RW next session as PT anticipates her balance will be improved greatly by BUE support. PT encourages gentle AROM of neck to improve mobility and to aide in gradually reducing pain. Pt concerned about negotiating stairs at home and will benefit from stair training at next session to reach her bedroom. Pt does mention possibly wanting a hospital bed to sleep on the first floor of the home however PT anticipates the pt will be able to ascend/descend steps with assistance on one person after PT training. PT currently recommends HHPT at the time of discharge.    Follow Up Recommendations Home health PT;Supervision for mobility/OOB    Equipment Recommendations  Rolling walker with 5" wheels;3in1 (PT) (pt is checking, may have RW at home)    Recommendations for Other Services       Precautions / Restrictions Precautions Precautions: Fall Precaution Comments: cervical precautions utilized for comfort  Restrictions Weight Bearing Restrictions: No      Mobility  Bed Mobility Overal bed mobility: Needs Assistance Bed Mobility: Rolling;Sidelying to Sit Rolling: Min guard Sidelying to sit: Min guard       General bed mobility comments: pt received and left in recliner  Transfers Overall transfer level: Needs  assistance Equipment used: None Transfers: Sit to/from Stand Sit to Stand: Min guard         General transfer comment: min guard to steady   Ambulation/Gait Ambulation/Gait assistance: Min assist;Min guard Gait Distance (Feet): 100 Feet Assistive device: IV Pole Gait Pattern/deviations: Step-to pattern Gait velocity: reduced Gait velocity interpretation: <1.8 ft/sec, indicate of risk for recurrent falls General Gait Details: pt with slowed step to gait, one minor LOB requiring minA to correct but otherwise minG for ambulation, slight increase in lateral sway  Stairs            Wheelchair Mobility    Modified Rankin (Stroke Patients Only)       Balance Overall balance assessment: Needs assistance Sitting-balance support: No upper extremity supported;Feet supported Sitting balance-Leahy Scale: Good Sitting balance - Comments: supervision to don socks   Standing balance support: Single extremity supported Standing balance-Leahy Scale: Poor Standing balance comment: reliant on UE support of IV pole and minG                             Pertinent Vitals/Pain Pain Assessment: Faces Faces Pain Scale: Hurts little more Pain Location: neck Pain Descriptors / Indicators: Grimacing Pain Intervention(s): Monitored during session    Home Living Family/patient expects to be discharged to:: Private residence Living Arrangements: Spouse/significant other;Parent;Children Available Help at Discharge: Family;Available 24 hours/day Type of Home: House       Home Layout: Two level;Bed/bath upstairs Home Equipment: Toilet riser;Shower seat;Cane - single point (may have access to RW, checking with mother)      Prior Function Level of Independence:  Independent         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     Hand Dominance   Dominant Hand: Right    Extremity/Trunk Assessment   Upper  Extremity Assessment Upper Extremity Assessment: Defer to OT evaluation    Lower Extremity Assessment Lower Extremity Assessment: Overall WFL for tasks assessed    Cervical / Trunk Assessment Cervical / Trunk Assessment: Other exceptions Cervical / Trunk Exceptions: incision on posterior neck, limited cervical AROM 2/2 discomfort  Communication   Communication: No difficulties  Cognition Arousal/Alertness: Awake/alert Behavior During Therapy: WFL for tasks assessed/performed Overall Cognitive Status: Within Functional Limits for tasks assessed                                        General Comments General comments (skin integrity, edema, etc.): pt tachy into 130s with activity, otherwise VSS    Exercises Other Exercises Other Exercises: PT encourages gentle AROM of C-spine   Assessment/Plan    PT Assessment Patient needs continued PT services  PT Problem List Decreased activity tolerance;Decreased mobility;Decreased balance;Decreased knowledge of use of DME;Pain       PT Treatment Interventions DME instruction;Gait training;Stair training;Functional mobility training;Therapeutic activities;Therapeutic exercise;Balance training;Neuromuscular re-education;Patient/family education    PT Goals (Current goals can be found in the Care Plan section)  Acute Rehab PT Goals Patient Stated Goal: To go home and return to independence PT Goal Formulation: With patient Time For Goal Achievement: 10/31/19 Potential to Achieve Goals: Good    Frequency Min 4X/week   Barriers to discharge        Co-evaluation               AM-PAC PT "6 Clicks" Mobility  Outcome Measure Help needed turning from your back to your side while in a flat bed without using bedrails?: A Little Help needed moving from lying on your back to sitting on the side of a flat bed without using bedrails?: A Little Help needed moving to and from a bed to a chair (including a wheelchair)?: A  Little Help needed standing up from a chair using your arms (e.g., wheelchair or bedside chair)?: A Little Help needed to walk in hospital room?: A Little Help needed climbing 3-5 steps with a railing? : A Little 6 Click Score: 18    End of Session   Activity Tolerance: Patient tolerated treatment well Patient left: in chair;with call bell/phone within reach;with family/visitor present Nurse Communication: Mobility status PT Visit Diagnosis: Unsteadiness on feet (R26.81);Other abnormalities of gait and mobility (R26.89);Pain Pain - part of body:  (neck)    Time: 4403-4742 PT Time Calculation (min) (ACUTE ONLY): 34 min   Charges:   PT Evaluation $PT Eval Moderate Complexity: 1 Mod PT Treatments $Gait Training: 8-22 mins        Arlyss Gandy, PT, DPT Acute Rehabilitation Pager: [phone]   Arlyss Gandy 10/17/2019, 12:34 PM

## 2019-10-18 MED ORDER — DEXAMETHASONE 4 MG PO TABS
4.0000 mg | ORAL_TABLET | Freq: Four times a day (QID) | ORAL | Status: DC
  Administered 2019-10-18 – 2019-10-19 (×4): 4 mg via ORAL
  Filled 2019-10-18 (×4): qty 1

## 2019-10-18 NOTE — TOC Progression Note (Signed)
Transition of Care [facility]) - Progression Note    Patient Details  Name: Jade Cabrera MRN: [id number] Date of Birth: 1960/10/28  Transition of [facility]) CM/SW Contact  Annalee Genta, LCSW Phone Number: 10/18/2019, 12:00 PM  Clinical Narrative: CSW spoke with patient and partner regarding physical therapy's recommendation for home health services on discharge. Patient provided verbal consent to arrange [facility] PT and OT with the highest rated provided that accepts their insurance. CSW also noted family inquired into a hospital bed. CSW informed RNCM and will coordinate with RNCM for discharge supports.       Expected Discharge Plan and Services                                                 Social Determinants of Health (SDOH) Interventions    Readmission Risk Interventions No flowsheet data found.

## 2019-10-18 NOTE — TOC Progression Note (Signed)
Transition of Care [facility]) - Progression Note    Patient Details  Name: Jade Cabrera MRN: [id number] Date of Birth: 07/28/1960  Transition of Care [facility]) CM/SW Contact  Bess Kinds, RN Phone Number: [phone] 10/18/2019, 12:54 PM  Clinical Narrative:     Spoke with patient and spouse at the bedside. Discussed home health needs for PT and OT. Referral accepted by [facility]. Patient will need HH orders for PT and OT with Face to Face at discharge. TOC following for transition needs.        Expected Discharge Plan and Services                                                 Social Determinants of Health (SDOH) Interventions    Readmission Risk Interventions No flowsheet data found.

## 2019-10-18 NOTE — Progress Notes (Signed)
Physical Therapy Treatment Patient Details Name: Jade Cabrera MRN: [id number] DOB: [birth year] Today's Date: 10/18/2019    History of Present Illness Pt is a 59 y/o female with longstanding chronic headaches and some difficulty swallowing.  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 S/P Chiari decompression 10/15/19.     PT Comments    Pt tolerates treatment well with significant improvement in activity tolerance. Pt demonstrates improved stability of gait with use of RW, and is able to negotiate a flight of stairs with use of railing and limited assistance from PT. PT provides education on safety and device management of various areas of the home based on patient and spouse's description. Pt will benefit from continued acute PT POC to improve gait and balance quality and to restore independence.   Follow Up Recommendations  Home health PT;Supervision for mobility/OOB     Equipment Recommendations  Rolling walker with 5" wheels;3in1 (PT) (pt's mother-in-law may have a RW)    Recommendations for Other Services       Precautions / Restrictions Precautions Precautions: Fall Restrictions Weight Bearing Restrictions: No    Mobility  Bed Mobility Overal bed mobility: Needs Assistance Bed Mobility: Supine to Sit     Supine to sit: Supervision        Transfers Overall transfer level: Needs assistance Equipment used: None Transfers: Sit to/from Stand Sit to Stand: Supervision            Ambulation/Gait Ambulation/Gait assistance: Supervision Gait Distance (Feet): 300 Feet Assistive device: Rolling walker (2 wheeled) Gait Pattern/deviations: Step-through pattern Gait velocity: reduced Gait velocity interpretation: <1.8 ft/sec, indicate of risk for recurrent falls General Gait Details: steady step through gait with BUE support of RW   Stairs Stairs: Yes Stairs assistance: Min guard Stair  Management: One rail Right;Step to pattern Number of Stairs: 10     Wheelchair Mobility    Modified Rankin (Stroke Patients Only)       Balance Overall balance assessment: Needs assistance Sitting-balance support: No upper extremity supported;Feet supported Sitting balance-Leahy Scale: Good Sitting balance - Comments: supervision   Standing balance support: No upper extremity supported Standing balance-Leahy Scale: Fair Standing balance comment: maintains static standign without UE support with supervision, reliant on UE support for dynamic standing balance                            Cognition Arousal/Alertness: Awake/alert Behavior During Therapy: WFL for tasks assessed/performed Overall Cognitive Status: Within Functional Limits for tasks assessed                                        Exercises      General Comments General comments (skin integrity, edema, etc.): VSS during session      Pertinent Vitals/Pain Pain Assessment: Faces Faces Pain Scale: Hurts little more Pain Location: Left upper trap Pain Descriptors / Indicators: Grimacing Pain Intervention(s): Monitored during session    Home Living                      Prior Function            PT Goals (current goals can now be found in the care plan section) Acute Rehab PT Goals Patient Stated Goal: To go home and return to independence Progress towards PT  goals: Progressing toward goals    Frequency    Min 4X/week      PT Plan Current plan remains appropriate    Co-evaluation              AM-PAC PT "6 Clicks" Mobility   Outcome Measure  Help needed turning from your back to your side while in a flat bed without using bedrails?: None Help needed moving from lying on your back to sitting on the side of a flat bed without using bedrails?: None Help needed moving to and from a bed to a chair (including a wheelchair)?: None Help needed standing up from  a chair using your arms (e.g., wheelchair or bedside chair)?: None Help needed to walk in hospital room?: None Help needed climbing 3-5 steps with a railing? : A Little 6 Click Score: 23    End of Session   Activity Tolerance: Patient tolerated treatment well Patient left: in bed;with call bell/phone within reach;with family/visitor present Nurse Communication: Mobility status PT Visit Diagnosis: Unsteadiness on feet (R26.81);Other abnormalities of gait and mobility (R26.89);Pain Pain - part of body:  (neck)     Time: 9628-3662 PT Time Calculation (min) (ACUTE ONLY): 39 min  Charges:  $Gait Training: 23-37 mins $Therapeutic Activity: 8-22 mins                     Arlyss Gandy, PT, DPT Acute Rehabilitation Pager: [phone]    Arlyss Gandy 10/18/2019, 1:51 PM

## 2019-10-18 NOTE — Progress Notes (Signed)
Subjective: Patient reports Doing well significant provement headaches and swallowing  Objective: Vital signs in last 24 hours: Temp:  [97.7 F (36.5 C)-98.7 F (37.1 C)] 97.7 F (36.5 C) (07/17 0803) Pulse Rate:  [73-111] 91 (07/17 0803) Resp:  [11-20] 19 (07/17 0803) BP: (108-132)/(65-80) 119/72 (07/17 0803) SpO2:  [96 %-99 %] 97 % (07/17 0803)  Intake/Output from previous day: 07/16 0701 - 07/17 0700 In: 365.6 [I.V.:365.6] Out: -  Intake/Output this shift: Total I/O In: 400 [P.O.:400] Out: -   Awake and alert incision clean dry and intact  Lab Results: Recent Labs    10/15/19 1404  WBC 4.8  HGB 12.0  HCT 37.2  PLT 264   BMET Recent Labs    10/15/19 1404 10/16/19 0510  NA 140 137  K 3.8 4.3  CL 104 103  CO2 24 25  GLUCOSE 138* 153*  BUN 13 14  CREATININE 0.58 0.68  CALCIUM 8.9 8.8*    Studies/Results: DG Swallowing Func-Speech Pathology  Result Date: 10/17/2019 Objective Swallowing Evaluation: Type of Study: MBS-Modified Barium Swallow Study  Patient Details Name: Jade Cabrera MRN: [id number] Date of Birth: 08/19/1960 Today's Date: 10/17/2019 Time: SLP Start Time (ACUTE ONLY): 1232 -SLP Stop Time (ACUTE ONLY): 1303 SLP Time Calculation (min) (ACUTE ONLY): 31 min Past Medical History: Past Medical History: Diagnosis Date . ADHD  . Anemia   borderline . Depression  . GAD (generalized anxiety disorder)  . GERD without esophagitis  . Headache   headaches with Chiari malformation, migraines during teenage years and pregnancy . History of bulimia  . Low back pain  . Sleep apnea   has a cpap but has not been using the past 2-3 months due to the Chiari malformation (10/03/19) Past Surgical History: Past Surgical History: Procedure Laterality Date . COLONOSCOPY   . SUBOCCIPITAL CRANIECTOMY CERVICAL LAMINECTOMY N/A 10/15/2019  Procedure: Chiari Decompression and Cervical One laminectomy;  Surgeon: Donalee Citrin, MD;  Location: [facility] OR;  Service: Neurosurgery;  Laterality: N/A; .  WISDOM TOOTH EXTRACTION   HPI: Pt is a 59 year old female with progressive clinical symptoms including headaches and difficulty swallowing, presenting for suboccipital decompression craniotomy with C1 laminectomy for Chiari decompression 7/14. PMH also includes: sleep apnea, GERD, GAD, depression, ADHD  Subjective: alert, pleasant Assessment / Plan / Recommendation CHL IP CLINICAL IMPRESSIONS 10/17/2019 Clinical Impression Pt presents with a mild pharyngeal dysphagia characterized primarily by reduced epiglottic inversion, anterior hyoid movement, laryngeal vestibule closure, and UES opening. She has mild residue in the pyriform sinuses with thin liquids, which is associate with most of the throat clearing noted. She also performs a spontaneous second swallow that clears this residue efficiently. A chin tuck does not facilitate pharyngeal clearance and actually results in trace aspiration. She also had trace aspiration that occurred when she was drinking consecutive straw sips and with mixed consistency bolus. Almost every time that aspiration occurred, a spontaneous throat clear was sufficient to clear her airway. There was a single instance in which one was not spontaneously elicited, but a cued throat clear was just as effective. Pt has improved function with thickened liquids and solids. Recommend advancing diet to regular solids and thin liquids with use of single sips, intermittent throat clear, and avoiding mixed consistencies such as pills with thin liquids. Given that pt has had some fluctuating symptoms, would recommend sending her home with her thickener, which could be used at times when she does not feel she is swallowing as well. Also recommend [facility] SLP f/u to maximize  swallowing safety.  SLP Visit Diagnosis Dysphagia, pharyngeal phase (R13.13) Attention and concentration deficit following -- Frontal lobe and executive function deficit following -- Impact on safety and function Mild aspiration risk   CHL  IP TREATMENT RECOMMENDATION 10/17/2019 Treatment Recommendations Therapy as outlined in treatment plan below   Prognosis 10/17/2019 Prognosis for Safe Diet Advancement Good Barriers to Reach Goals -- Barriers/Prognosis Comment -- CHL IP DIET RECOMMENDATION 10/17/2019 SLP Diet Recommendations Regular solids;Thin liquid Liquid Administration via Cup;Straw Medication Administration Whole meds with puree Compensations Slow rate;Small sips/bites;Clear throat intermittently;Other (Comment) Postural Changes Seated upright at 90 degrees   CHL IP OTHER RECOMMENDATIONS 10/17/2019 Recommended Consults -- Oral Care Recommendations Oral care BID Other Recommendations --   CHL IP FOLLOW UP RECOMMENDATIONS 10/17/2019 Follow up Recommendations Home health SLP   CHL IP FREQUENCY AND DURATION 10/17/2019 Speech Therapy Frequency (ACUTE ONLY) min 2x/week Treatment Duration 2 weeks      CHL IP ORAL PHASE 10/17/2019 Oral Phase WFL Oral - Pudding Teaspoon -- Oral - Pudding Cup -- Oral - Honey Teaspoon -- Oral - Honey Cup -- Oral - Nectar Teaspoon -- Oral - Nectar Cup -- Oral - Nectar Straw -- Oral - Thin Teaspoon -- Oral - Thin Cup -- Oral - Thin Straw -- Oral - Puree -- Oral - Mech Soft -- Oral - Regular -- Oral - Multi-Consistency -- Oral - Pill -- Oral Phase - Comment --  CHL IP PHARYNGEAL PHASE 10/17/2019 Pharyngeal Phase Impaired Pharyngeal- Pudding Teaspoon -- Pharyngeal -- Pharyngeal- Pudding Cup -- Pharyngeal -- Pharyngeal- Honey Teaspoon -- Pharyngeal -- Pharyngeal- Honey Cup -- Pharyngeal -- Pharyngeal- Nectar Teaspoon -- Pharyngeal -- Pharyngeal- Nectar Cup -- Pharyngeal -- Pharyngeal- Nectar Straw Reduced anterior laryngeal mobility;Reduced airway/laryngeal closure;Reduced epiglottic inversion;Pharyngeal residue - pyriform Pharyngeal -- Pharyngeal- Thin Teaspoon -- Pharyngeal -- Pharyngeal- Thin Cup Reduced anterior laryngeal mobility;Reduced airway/laryngeal closure;Reduced epiglottic inversion;Pharyngeal residue -  pyriform;Penetration/Aspiration before swallow Pharyngeal Material enters airway, CONTACTS cords and then ejected out Pharyngeal- Thin Straw Reduced anterior laryngeal mobility;Reduced airway/laryngeal closure;Reduced epiglottic inversion;Pharyngeal residue - pyriform;Penetration/Aspiration before swallow Pharyngeal Material enters airway, passes BELOW cords then ejected out Pharyngeal- Puree Reduced anterior laryngeal mobility;Reduced airway/laryngeal closure;Reduced epiglottic inversion Pharyngeal -- Pharyngeal- Mechanical Soft Reduced anterior laryngeal mobility;Reduced airway/laryngeal closure;Reduced epiglottic inversion Pharyngeal -- Pharyngeal- Regular -- Pharyngeal -- Pharyngeal- Multi-consistency -- Pharyngeal -- Pharyngeal- Pill Reduced anterior laryngeal mobility;Reduced airway/laryngeal closure;Reduced epiglottic inversion Pharyngeal -- Pharyngeal Comment --  CHL IP CERVICAL ESOPHAGEAL PHASE 10/17/2019 Cervical Esophageal Phase Impaired Pudding Teaspoon -- Pudding Cup -- Honey Teaspoon -- Honey Cup -- Nectar Teaspoon -- Nectar Cup -- Nectar Straw Reduced cricopharyngeal relaxation Thin Teaspoon -- Thin Cup Reduced cricopharyngeal relaxation Thin Straw Reduced cricopharyngeal relaxation Puree Reduced cricopharyngeal relaxation Mechanical Soft Reduced cricopharyngeal relaxation Regular -- Multi-consistency -- Pill -- Cervical Esophageal Comment -- Jade Cabrera., M.A. CCC-SLP Acute Rehabilitation Services Pager [phone] Office [phone] 10/17/2019, 2:51 PM               Assessment/Plan: Continue to work with physical Occupational Therapy continue 1 more day probable discharge tomorrow  LOS: 3 days     Jade Cabrera 10/18/2019, 9:38 AM

## 2019-10-18 NOTE — Progress Notes (Signed)
  Speech Language Pathology Treatment: Dysphagia  Patient Details Name: Jade Cabrera MRN: [id number] DOB: 12-08-1960 Today's Date: 10/18/2019 Time: 1700-1749 SLP Time Calculation (min) (ACUTE ONLY): 24 min  Assessment / Plan / Recommendation Clinical Impression  Pt was seen for f/u education after MBS on previous date given plans for d/c home (likely tomorrow now per MD). Recommendations were reviewed, including methods for swallowing medications and review of foods that would be slightly softer so she can gradually increase her diet per her comfort level. She consumed several sips of thin liquids, still utilizing a spontaneous second swallow but now with minimal throat clearing even compared to yesterday. Continue to recommend liberalized diet of regular solids and thin liquids, but would also send pt home with can of thickener in case of any fluctuating abilities in this early phase of recovery. She will still benefit from [facility] SLP services as well.   HPI HPI: Pt is a 59 year old female with progressive clinical symptoms including headaches and difficulty swallowing, presenting for suboccipital decompression craniotomy with C1 laminectomy for Chiari decompression 7/14. PMH also includes: sleep apnea, GERD, GAD, depression, ADHD      SLP Plan  Continue with current plan of care       Recommendations  Diet recommendations: Regular;Thin liquid Liquids provided via: Cup;Straw Medication Administration: Whole meds with puree Supervision: Patient able to self feed;Intermittent supervision to cue for compensatory strategies Compensations: Slow rate;Small sips/bites;Clear throat intermittently;Other (Comment) (single sips) Postural Changes and/or Swallow Maneuvers: Seated upright 90 degrees                Oral Care Recommendations: Oral care BID Follow up Recommendations: Home health SLP SLP Visit Diagnosis: Dysphagia, pharyngeal phase (R13.13) Plan: Continue with current plan of  care       GO                Mahala Menghini., M.A. CCC-SLP Acute Rehabilitation Services Pager [phone] Office [phone]  10/18/2019, 8:54 AM

## 2019-10-19 MED ORDER — PREDNISONE 10 MG (21) PO TBPK
ORAL_TABLET | ORAL | 0 refills | Status: AC
Start: 2019-10-19 — End: ?

## 2019-10-19 MED ORDER — METHOCARBAMOL 500 MG PO TABS: 500.0000 mg | ORAL_TABLET | Freq: Four times a day (QID) | ORAL | 0 refills | Status: AC

## 2019-10-19 MED ORDER — OXYCODONE-ACETAMINOPHEN 5-325 MG PO TABS: 1.0000 | ORAL_TABLET | ORAL | 0 refills | Status: AC | PRN

## 2019-10-19 NOTE — TOC Transition Note (Signed)
Transition of Care [facility]) - CM/SW Discharge Note   Patient Details  Name: Jade Cabrera MRN: [id number] Date of Birth: November 21, 1960  Transition of Care [facility]) CM/SW Contact:  Bess Kinds, RN Phone [phone] 10/19/2019, 2:05 PM   Clinical Narrative:     Patient transitioned home today. Notified provider for home health orders. Notified [facility] of discharge. No further TOC needs identified.   Final next level of care: Home w Home Health Services Barriers to Discharge: No Barriers Identified   Patient Goals and CMS Choice        Discharge Placement                       Discharge Plan and Services                          HH Arranged: PT, OT HH Agency: [facility] Date [facility] Agency Contacted: 10/19/19 Time HH Agency Contacted: 1405 Representative spoke with at [facility] Agency: Kandee Keen  Social Determinants of Health (SDOH) Interventions     Readmission Risk Interventions No flowsheet data found.

## 2019-10-19 NOTE — Progress Notes (Signed)
Occupational Therapy Treatment Patient Details Name: Jade Cabrera MRN: [id number] DOB: [birth year] Today's Date: 10/19/2019    History of present illness Pt is a 59 y/o female with longstanding chronic headaches and some difficulty swallowing.  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 S/P Chiari decompression 10/15/19.    OT comments  Pt very pleasant and eager to participate in OT session. Pt performing room level mobility pushing iv pole, hallway level mobility using RW, completing toileting and standing grooming ADL tasks at supervision level throughout. Further reviewed and educated in safety and compensatory strategies for completing ADL and functional transfers with pt/pt's spouse verbalizing and pt return demonstrating understanding throughout. Pt making steady progress towards OT goals. Max HR noted 124 bpm with standing activity. She plans to return home with assist from spouse PRN for ADL/iADL tasks. Will continue to follow while pt remains acutely admitted.    Follow Up Recommendations  No OT follow up;Supervision/Assistance - 24 hour    Equipment Recommendations  None recommended by OT          Precautions / Restrictions Precautions Precautions: Fall Restrictions Weight Bearing Restrictions: No       Mobility Bed Mobility Overal bed mobility: Needs Assistance Bed Mobility: Supine to Sit     Supine to sit: Supervision        Transfers Overall transfer level: Needs assistance Equipment used: None Transfers: Sit to/from Stand Sit to Stand: Supervision              Balance Overall balance assessment: Needs assistance Sitting-balance support: No upper extremity supported;Feet supported Sitting balance-Leahy Scale: Good Sitting balance - Comments: supervision   Standing balance support: No upper extremity supported Standing balance-Leahy Scale: Fair Standing balance comment:  maintains static standign without UE support with supervision, reliant on UE support for dynamic standing balance                           ADL either performed or assessed with clinical judgement   ADL Overall ADL's : Needs assistance/impaired     Grooming: Oral care;Wash/dry face;Wash/dry hands;Supervision/safety;Standing Grooming Details (indicate cue type and reason): standing at sink in room, pt demonstrating compenstaory technique for oral care        Lower Body Bathing Details (indicate cue type and reason): discussed compensatory techniques      Lower Body Dressing: Supervision/safety;Sit to/from stand Lower Body Dressing Details (indicate cue type and reason): pt using figure 4 to adjust her socks while seated EOB Toilet Transfer: Supervision/safety;Ambulation;Regular Toilet;Grab bars Toilet Transfer Details (indicate cue type and reason): pushing iv pole for in room mobility Toileting- Clothing Manipulation and Hygiene: Supervision/safety;Sitting/lateral lean;Sit to/from stand Toileting - Clothing Manipulation Details (indicate cue type and reason): pt performing clothing management (gown and shorts) and pericare    Tub/Shower Transfer Details (indicate cue type and reason): discussed use of seat in shower, how to bathe while keeping incision dry until cleared by MD to get wet  Functional mobility during ADLs: Supervision/safety;Rolling walker General ADL Comments: pushing iv pole in room and RW in hallway. discussed safety and compensatory techniques for completing ADL and mobility tasks. pt requesting to perform hallway level mobility and to practice stairs as well during session - completing both using handrail and RW for mobility at supervision - minguard assist level. pt navigating stairs x4 up/down (x3 trials) to simulate home environment and given limitations of iv pole/lines.  pt taking standing rest breaks with all activity PRN     Vision       Perception      Praxis      Cognition Arousal/Alertness: Awake/alert Behavior During Therapy: WFL for tasks assessed/performed Overall Cognitive Status: Within Functional Limits for tasks assessed                                          Exercises     Shoulder Instructions       General Comments HR up to 124 with standing activity    Pertinent Vitals/ Pain       Pain Assessment: Faces Faces Pain Scale: Hurts little more Pain Location: Left upper trap Pain Descriptors / Indicators: Grimacing;Discomfort Pain Intervention(s): Monitored during session;Repositioned  Home Living                                          Prior Functioning/Environment              Frequency  Min 2X/week        Progress Toward Goals  OT Goals(current goals can now be found in the care plan section)  Progress towards OT goals: Progressing toward goals  Acute Rehab OT Goals Patient Stated Goal: To go home and return to independence OT Goal Formulation: With patient Time For Goal Achievement: 10/31/19 Potential to Achieve Goals: Good ADL Goals Pt Will Perform Grooming: with modified independence;standing Pt Will Perform Lower Body Bathing: sit to/from stand;sitting/lateral leans;with supervision Pt Will Perform Lower Body Dressing: with supervision;sit to/from stand;sitting/lateral leans Pt Will Transfer to Toilet: with supervision;ambulating;regular height toilet;bedside commode Pt Will Perform Tub/Shower Transfer: with supervision;shower seat;ambulating  Plan Discharge plan remains appropriate    Co-evaluation                 AM-PAC OT "6 Clicks" Daily Activity     Outcome Measure   Help from another person eating meals?: None Help from another person taking care of personal grooming?: A Little Help from another person toileting, which includes using toliet, bedpan, or urinal?: A Little Help from another person bathing (including washing, rinsing,  drying)?: A Little Help from another person to put on and taking off regular upper body clothing?: A Little Help from another person to put on and taking off regular lower body clothing?: A Little 6 Click Score: 19    End of Session Equipment Utilized During Treatment: Gait belt;Rolling walker  OT Visit Diagnosis: Other abnormalities of gait and mobility (R26.89);Pain Pain - part of body:  (head/neck - incisional)   Activity Tolerance Patient tolerated treatment well   Patient Left in bed;with call bell/phone within reach;with family/visitor present;Other (comment) (seated EOB with MD present )   Nurse Communication Mobility status        Time: 9480-1655 OT Time Calculation (min): 44 min  Charges: OT General Charges $OT Visit: 1 Visit OT Treatments $Self Care/Home Management : 38-52 mins  Marcy Siren, OT Acute Rehabilitation Services Pager [phone] Office [phone]    Orlando Penner 10/19/2019, 10:57 AM

## 2019-10-19 NOTE — Progress Notes (Signed)
DC instructions given to pt and husband at bedside. Questions answered as needed. PIV removed from LFA pt tolerated well. Pt transported to personal vehicle via wc. Mayford Knife RN

## 2019-10-19 NOTE — Discharge Summary (Signed)
Physician Discharge Summary  Patient ID: Jade Cabrera MRN: [id number] DOB/AGE: Feb 01, 1961 59 y.o.  Admit date: 10/15/2019 Discharge date: 10/19/2019  Admission Diagnoses: Chiari I malformation     Discharge Diagnoses: same   Discharged Condition: good  Hospital Course: The patient was admitted on 10/15/2019 and taken to the operating room where the patient underwent suboccipital craniectomy for decompression of posterior fossa with c1 laminectomy. The patient tolerated the procedure well and was taken to the recovery room and then to the icu in stable condition. The hospital course was routine. There were no complications. The wound remained clean dry and intact. Pt had appropriate neck soreness. No complaints of arm pain or new N/T/W. The patient remained afebrile with stable vital signs, and tolerated a regular diet. The patient continued to increase activities, and pain was well controlled with oral pain medications.   Consults: None  Significant Diagnostic Studies:  Results for orders placed or performed during the hospital encounter of 10/15/19  MRSA PCR Screening   Specimen: Nasopharyngeal  Result Value Ref Range   MRSA by PCR NEGATIVE NEGATIVE  Basic metabolic panel  Result Value Ref Range   Sodium 140 135 - 145 mmol/L   Potassium 3.8 3.5 - 5.1 mmol/L   Chloride 104 98 - 111 mmol/L   CO2 24 22 - 32 mmol/L   Glucose, Bld 138 (H) 70 - 99 mg/dL   BUN 13 6 - 20 mg/dL   Creatinine, Ser 8.33 0.44 - 1.00 mg/dL   Calcium 8.9 8.9 - 38.3 mg/dL   GFR calc non Af Amer >60 >60 mL/min   GFR calc Af Amer >60 >60 mL/min   Anion gap 12 5 - 15  CBC  Result Value Ref Range   WBC 4.8 4.0 - 10.5 K/uL   RBC 4.26 3.87 - 5.11 MIL/uL   Hemoglobin 12.0 12.0 - 15.0 g/dL   HCT 29.1 36 - 46 %   MCV 87.3 80.0 - 100.0 fL   MCH 28.2 26.0 - 34.0 pg   MCHC 32.3 30.0 - 36.0 g/dL   RDW 91.6 60.6 - 00.4 %   Platelets 264 150 - 400 K/uL   nRBC 0.0 0.0 - 0.2 %  Basic metabolic panel  Result Value  Ref Range   Sodium 137 135 - 145 mmol/L   Potassium 4.3 3.5 - 5.1 mmol/L   Chloride 103 98 - 111 mmol/L   CO2 25 22 - 32 mmol/L   Glucose, Bld 153 (H) 70 - 99 mg/dL   BUN 14 6 - 20 mg/dL   Creatinine, Ser 5.99 0.44 - 1.00 mg/dL   Calcium 8.8 (L) 8.9 - 10.3 mg/dL   GFR calc non Af Amer >60 >60 mL/min   GFR calc Af Amer >60 >60 mL/min   Anion gap 9 5 - 15  ABO/Rh  Result Value Ref Range   ABO/RH(D)      A POS Performed at [facility], [address] [place], [place] [address]     DG Swallowing Func-Speech Pathology  Result Date: 10/17/2019 Objective Swallowing Evaluation: Type of Study: MBS-Modified Barium Swallow Study  Patient Details Name: Jade Cabrera MRN: [id number] Date of Birth: Feb 23, 1961 Today's Date: 10/17/2019 Time: SLP Start Time (ACUTE ONLY): 1232 -SLP Stop Time (ACUTE ONLY): 1303 SLP Time Calculation (min) (ACUTE ONLY): 31 min Past Medical History: Past Medical History: Diagnosis Date . ADHD  . Anemia   borderline . Depression  . GAD (generalized anxiety disorder)  . GERD without  esophagitis  . Headache   headaches with Chiari malformation, migraines during teenage years and pregnancy . History of bulimia  . Low back pain  . Sleep apnea   has a cpap but has not been using the past 2-3 months due to the Chiari malformation (10/03/19) Past Surgical History: Past Surgical History: Procedure Laterality Date . COLONOSCOPY   . SUBOCCIPITAL CRANIECTOMY CERVICAL LAMINECTOMY N/A 10/15/2019  Procedure: Chiari Decompression and Cervical One laminectomy;  Surgeon: Donalee Citrin, MD;  Location: [facility];  Service: Neurosurgery;  Laterality: N/A; . WISDOM TOOTH EXTRACTION   HPI: Pt is a 59 year old female with progressive clinical symptoms including headaches and difficulty swallowing, presenting for suboccipital decompression craniotomy with C1 laminectomy for Chiari decompression 7/14. PMH also includes: sleep apnea, GERD, GAD, depression, ADHD  Subjective: alert, pleasant Assessment / Plan /  Recommendation CHL IP CLINICAL IMPRESSIONS 10/17/2019 Clinical Impression Pt presents with a mild pharyngeal dysphagia characterized primarily by reduced epiglottic inversion, anterior hyoid movement, laryngeal vestibule closure, and UES opening. She has mild residue in the pyriform sinuses with thin liquids, which is associate with most of the throat clearing noted. She also performs a spontaneous second swallow that clears this residue efficiently. A chin tuck does not facilitate pharyngeal clearance and actually results in trace aspiration. She also had trace aspiration that occurred when she was drinking consecutive straw sips and with mixed consistency bolus. Almost every time that aspiration occurred, a spontaneous throat clear was sufficient to clear her airway. There was a single instance in which one was not spontaneously elicited, but a cued throat clear was just as effective. Pt has improved function with thickened liquids and solids. Recommend advancing diet to regular solids and thin liquids with use of single sips, intermittent throat clear, and avoiding mixed consistencies such as pills with thin liquids. Given that pt has had some fluctuating symptoms, would recommend sending her home with her thickener, which could be used at times when she does not feel she is swallowing as well. Also recommend [facility] SLP f/u to maximize swallowing safety.  SLP Visit Diagnosis Dysphagia, pharyngeal phase (R13.13) Attention and concentration deficit following -- Frontal lobe and executive function deficit following -- Impact on safety and function Mild aspiration risk   CHL IP TREATMENT RECOMMENDATION 10/17/2019 Treatment Recommendations Therapy as outlined in treatment plan below   Prognosis 10/17/2019 Prognosis for Safe Diet Advancement Good Barriers to Reach Goals -- Barriers/Prognosis Comment -- CHL IP DIET RECOMMENDATION 10/17/2019 SLP Diet Recommendations Regular solids;Thin liquid Liquid Administration via Cup;Straw  Medication Administration Whole meds with puree Compensations Slow rate;Small sips/bites;Clear throat intermittently;Other (Comment) Postural Changes Seated upright at 90 degrees   CHL IP OTHER RECOMMENDATIONS 10/17/2019 Recommended Consults -- Oral Care Recommendations Oral care BID Other Recommendations --   CHL IP FOLLOW UP RECOMMENDATIONS 10/17/2019 Follow up Recommendations Home health SLP   CHL IP FREQUENCY AND DURATION 10/17/2019 Speech Therapy Frequency (ACUTE ONLY) min 2x/week Treatment Duration 2 weeks      CHL IP ORAL PHASE 10/17/2019 Oral Phase WFL Oral - Pudding Teaspoon -- Oral - Pudding Cup -- Oral - Honey Teaspoon -- Oral - Honey Cup -- Oral - Nectar Teaspoon -- Oral - Nectar Cup -- Oral - Nectar Straw -- Oral - Thin Teaspoon -- Oral - Thin Cup -- Oral - Thin Straw -- Oral - Puree -- Oral - Mech Soft -- Oral - Regular -- Oral - Multi-Consistency -- Oral - Pill -- Oral Phase - Comment --  CHL IP PHARYNGEAL PHASE 10/17/2019  Pharyngeal Phase Impaired Pharyngeal- Pudding Teaspoon -- Pharyngeal -- Pharyngeal- Pudding Cup -- Pharyngeal -- Pharyngeal- Honey Teaspoon -- Pharyngeal -- Pharyngeal- Honey Cup -- Pharyngeal -- Pharyngeal- Nectar Teaspoon -- Pharyngeal -- Pharyngeal- Nectar Cup -- Pharyngeal -- Pharyngeal- Nectar Straw Reduced anterior laryngeal mobility;Reduced airway/laryngeal closure;Reduced epiglottic inversion;Pharyngeal residue - pyriform Pharyngeal -- Pharyngeal- Thin Teaspoon -- Pharyngeal -- Pharyngeal- Thin Cup Reduced anterior laryngeal mobility;Reduced airway/laryngeal closure;Reduced epiglottic inversion;Pharyngeal residue - pyriform;Penetration/Aspiration before swallow Pharyngeal Material enters airway, CONTACTS cords and then ejected out Pharyngeal- Thin Straw Reduced anterior laryngeal mobility;Reduced airway/laryngeal closure;Reduced epiglottic inversion;Pharyngeal residue - pyriform;Penetration/Aspiration before swallow Pharyngeal Material enters airway, passes BELOW cords then  ejected out Pharyngeal- Puree Reduced anterior laryngeal mobility;Reduced airway/laryngeal closure;Reduced epiglottic inversion Pharyngeal -- Pharyngeal- Mechanical Soft Reduced anterior laryngeal mobility;Reduced airway/laryngeal closure;Reduced epiglottic inversion Pharyngeal -- Pharyngeal- Regular -- Pharyngeal -- Pharyngeal- Multi-consistency -- Pharyngeal -- Pharyngeal- Pill Reduced anterior laryngeal mobility;Reduced airway/laryngeal closure;Reduced epiglottic inversion Pharyngeal -- Pharyngeal Comment --  CHL IP CERVICAL ESOPHAGEAL PHASE 10/17/2019 Cervical Esophageal Phase Impaired Pudding Teaspoon -- Pudding Cup -- Honey Teaspoon -- Honey Cup -- Nectar Teaspoon -- Nectar Cup -- Nectar Straw Reduced cricopharyngeal relaxation Thin Teaspoon -- Thin Cup Reduced cricopharyngeal relaxation Thin Straw Reduced cricopharyngeal relaxation Puree Reduced cricopharyngeal relaxation Mechanical Soft Reduced cricopharyngeal relaxation Regular -- Multi-consistency -- Pill -- Cervical Esophageal Comment -- Mahala Menghini., M.A. CCC-SLP Acute Rehabilitation Services Pager [phone] Office [phone] 10/17/2019, 2:51 PM               Antibiotics:  Anti-infectives (From admission, onward)   Start     Dose/Rate Route Frequency Ordered Stop   10/15/19 1800  ceFAZolin (ANCEF) IVPB 2g/100 mL premix        2 g 200 mL/hr over 30 Minutes Intravenous Every 8 hours 10/15/19 1425 10/16/19 0211   10/15/19 1108  bacitracin 50,000 Units in sodium chloride 0.9 % 500 mL irrigation  Status:  Discontinued          As needed 10/15/19 1108 10/15/19 1352   10/15/19 0800  ceFAZolin (ANCEF) IVPB 2g/100 mL premix        2 g 200 mL/hr over 30 Minutes Intravenous On call to O.R. 10/15/19 0745 10/15/19 1021      Discharge Exam: Blood pressure 127/68, pulse 63, temperature 98.1 F (36.7 C), temperature source Oral, resp. rate 12, height 5\' 4"  (1.626 m), weight 68.6 kg, SpO2 96 %. Neurologic: Grossly normal Ambulating and voiding well,  incision cdi  Discharge Medications:   Allergies as of 10/19/2019   No Known Allergies     Medication List    TAKE these medications   cyclobenzaprine 5 MG tablet Commonly known as: FLEXERIL Take 5-10 mg by mouth at bedtime as needed for spasms.   diphenhydrAMINE 25 MG tablet Commonly known as: BENADRYL Take 25 mg by mouth every 6 (six) hours as needed (allergies.).   escitalopram 5 MG tablet Commonly known as: LEXAPRO Take 5 mg by mouth daily.   loratadine 10 MG tablet Commonly known as: CLARITIN Take 10 mg by mouth daily.   methocarbamol 500 MG tablet Commonly known as: Robaxin Take 1 tablet (500 mg total) by mouth 4 (four) times daily.   NON FORMULARY Apply topically at bedtime as needed. Apply to wart with applicator and cover with tape at bedtime.  Remove tape the next morning.   oxyCODONE-acetaminophen 5-325 MG tablet Commonly known as: Percocet Take 1 tablet by mouth every 4 (four) hours as needed for severe pain.   predniSONE 10  MG (21) Tbpk tablet Commonly known as: STERAPRED UNI-PAK 21 TAB Take for 12 days as directed       Disposition: home   Final Dx: suboccipital craniectomy for decompression of chiari  Discharge Instructions     Remove dressing in 72 hours   Complete by: As directed    Call MD for:  difficulty breathing, headache or visual disturbances   Complete by: As directed    Call MD for:  hives   Complete by: As directed    Call MD for:  persistant dizziness or light-headedness   Complete by: As directed    Call MD for:  persistant nausea and vomiting   Complete by: As directed    Call MD for:  redness, tenderness, or signs of infection (pain, swelling, redness, odor or green/yellow discharge around incision site)   Complete by: As directed    Call MD for:  severe uncontrolled pain   Complete by: As directed    Call MD for:  temperature >100.4   Complete by: As directed    Diet - low sodium heart healthy   Complete by: As directed     Driving Restrictions   Complete by: As directed    No driving for 2 weeks, no riding in the car for 1 week   Increase activity slowly   Complete by: As directed    Lifting restrictions   Complete by: As directed    No lifting more than 8 lbs       Follow-up Information    [facility]   Specialty: Home Health Services Why: the office will call to schedule home health physcial and occupational therapy visits Contact information: [address] [place] [address] [phone]                Signed: Tiana LoftKimberly Hannah [facility]Edit Ricciardelli 10/19/2019, 9:24 AM

## 2020-01-28 ENCOUNTER — Other Ambulatory Visit: Payer: Self-pay | Admitting: Family Medicine

## 2020-01-28 DIAGNOSIS — Z1231 Encounter for screening mammogram for malignant neoplasm of breast: Secondary | ICD-10-CM

## 2020-04-06 ENCOUNTER — Ambulatory Visit: Payer: BC Managed Care – PPO

## 2020-05-04 ENCOUNTER — Inpatient Hospital Stay: Admission: RE | Admit: 2020-05-04 | Payer: BC Managed Care – PPO | Source: Ambulatory Visit

## 2020-10-11 IMAGING — MG DIGITAL SCREENING BILAT W/ CAD
4 series · 4 of 4 positions shown · non-contrast
Comparison: Previous exam(s).

CLINICAL DATA: Screening.

EXAM:
DIGITAL SCREENING BILATERAL MAMMOGRAM WITH CAD

[R MLO]
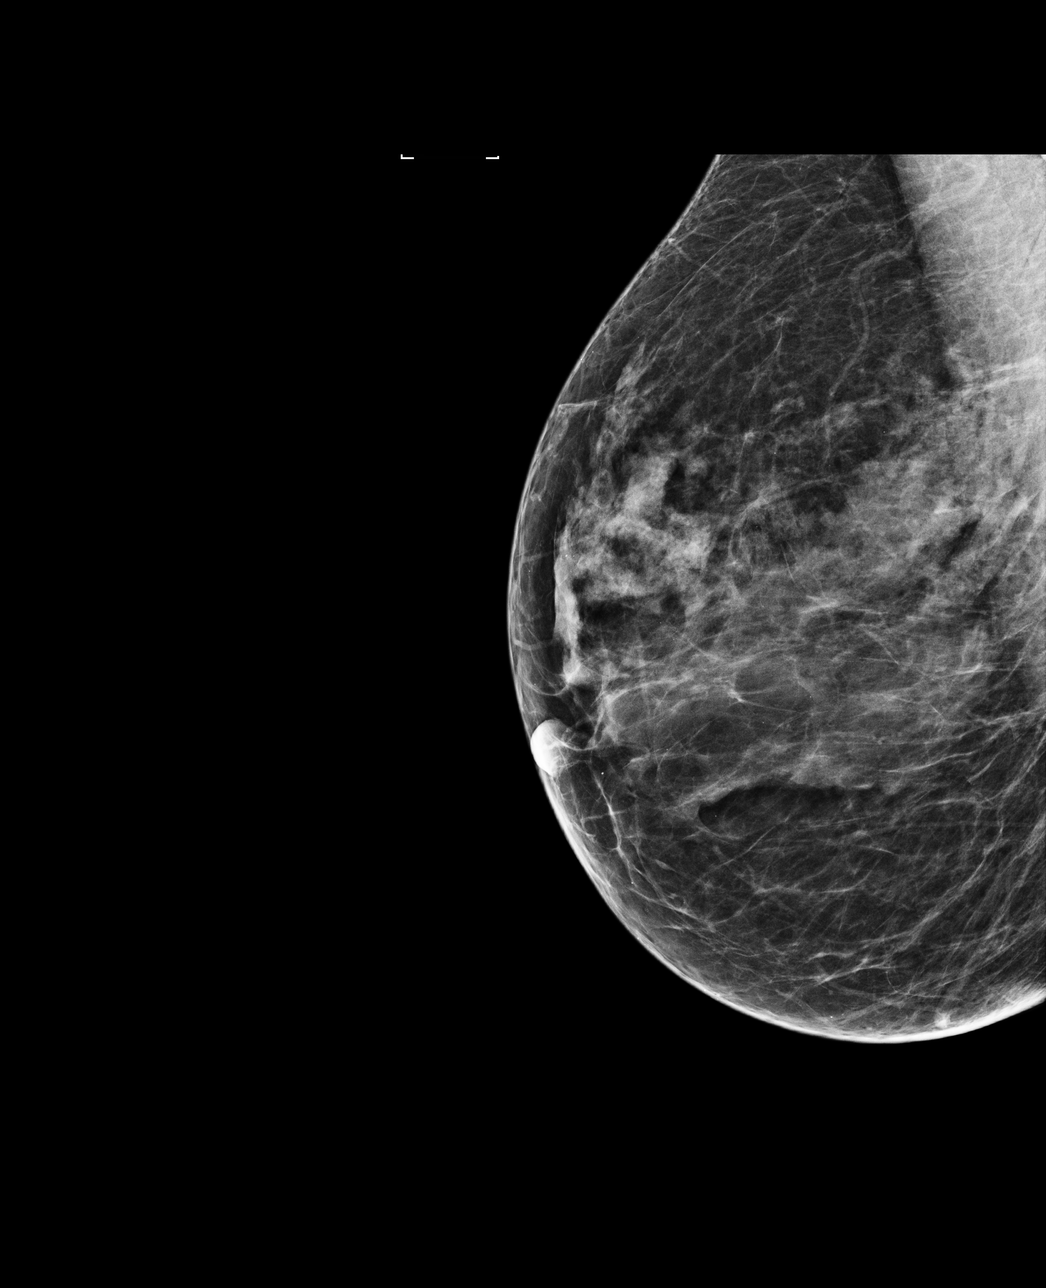

[R CC]
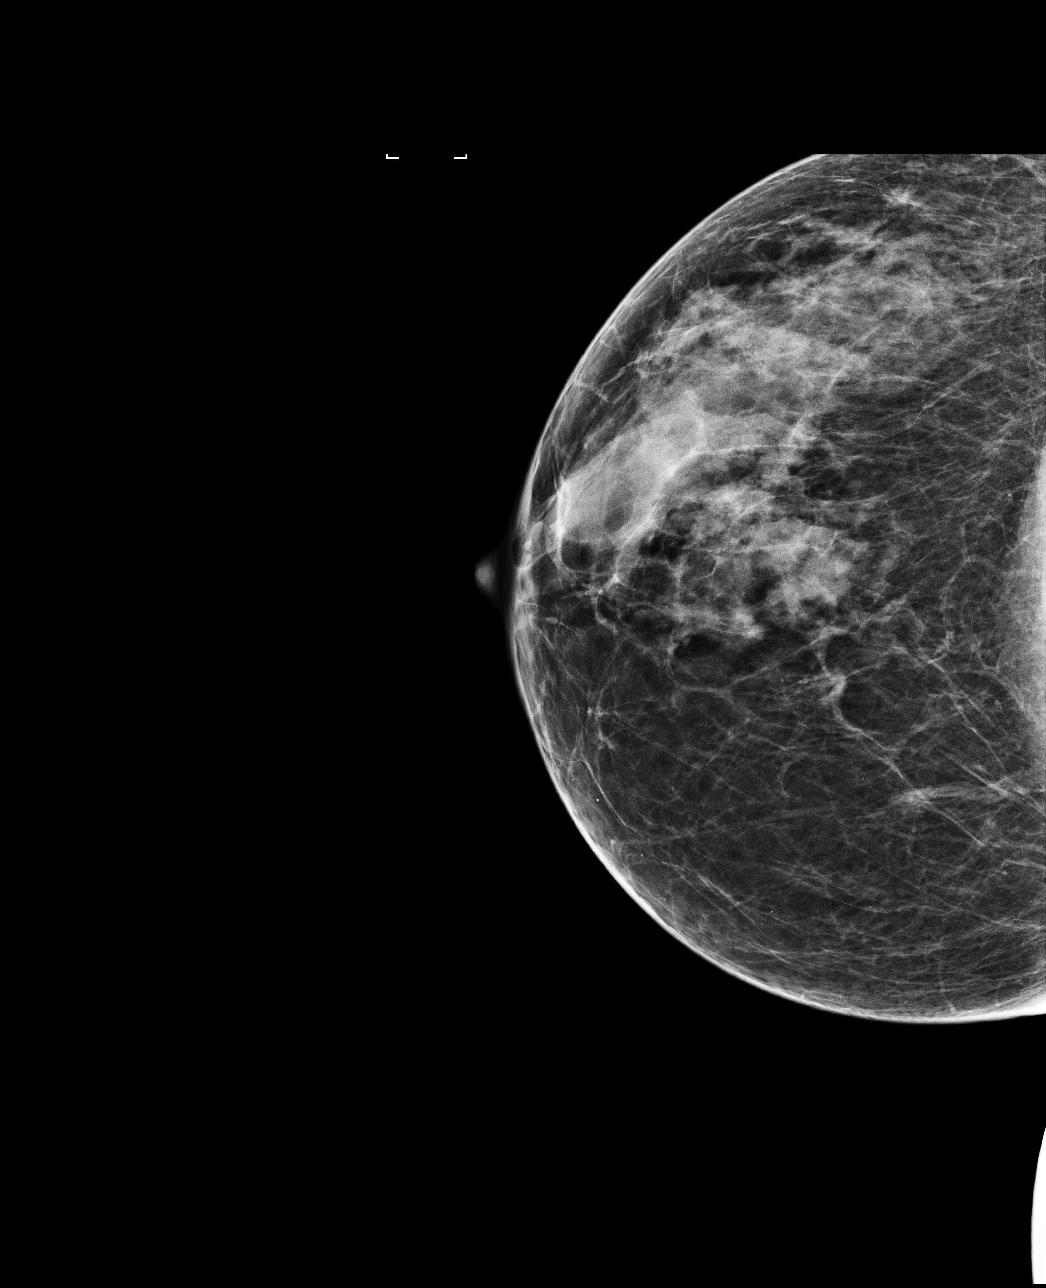

[L MLO]
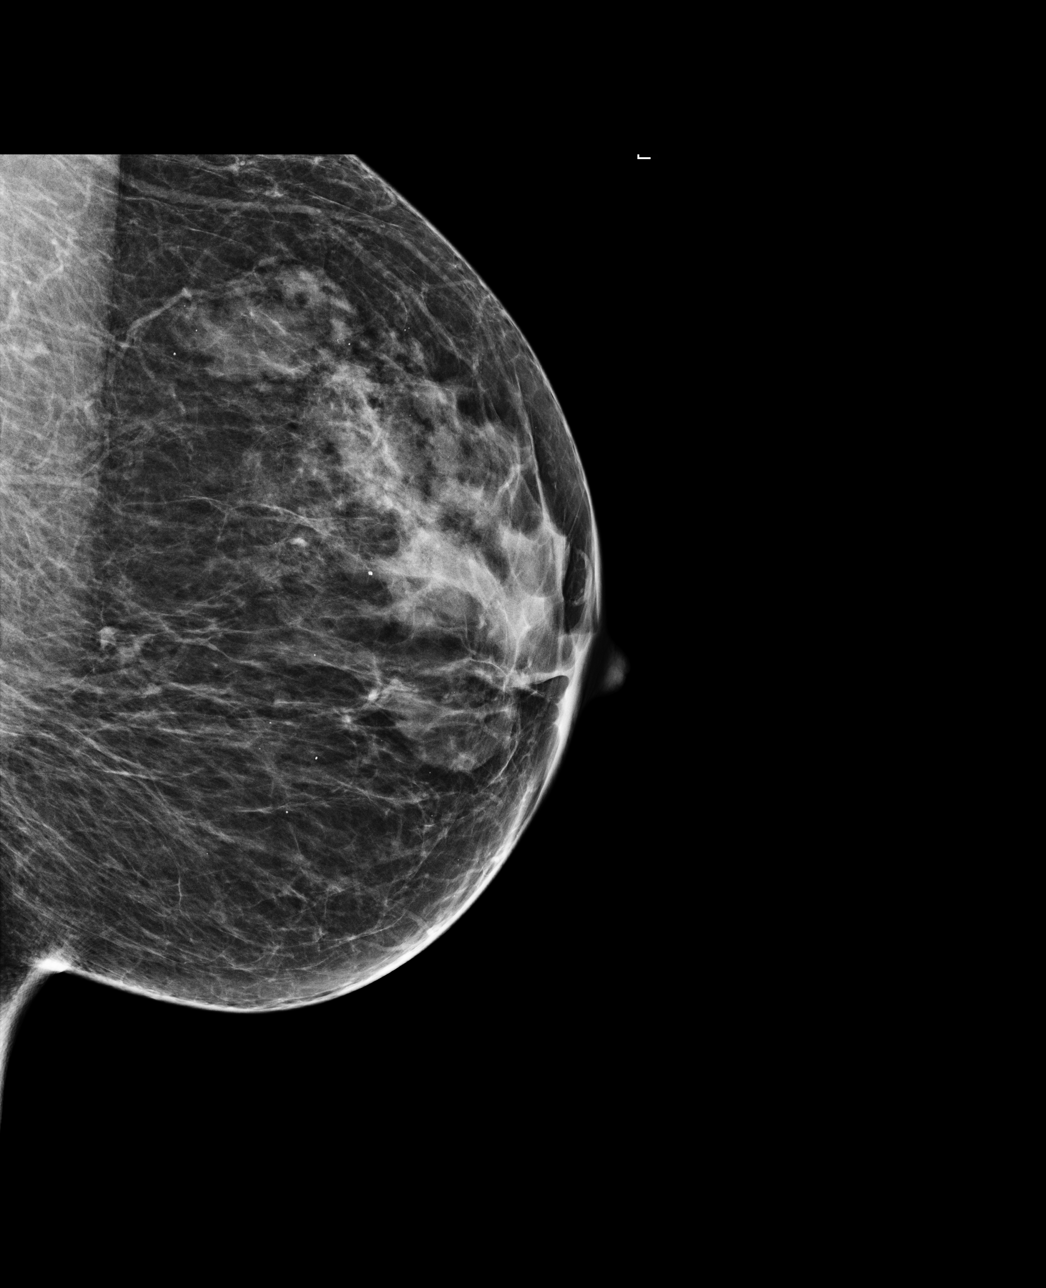

[L CC]
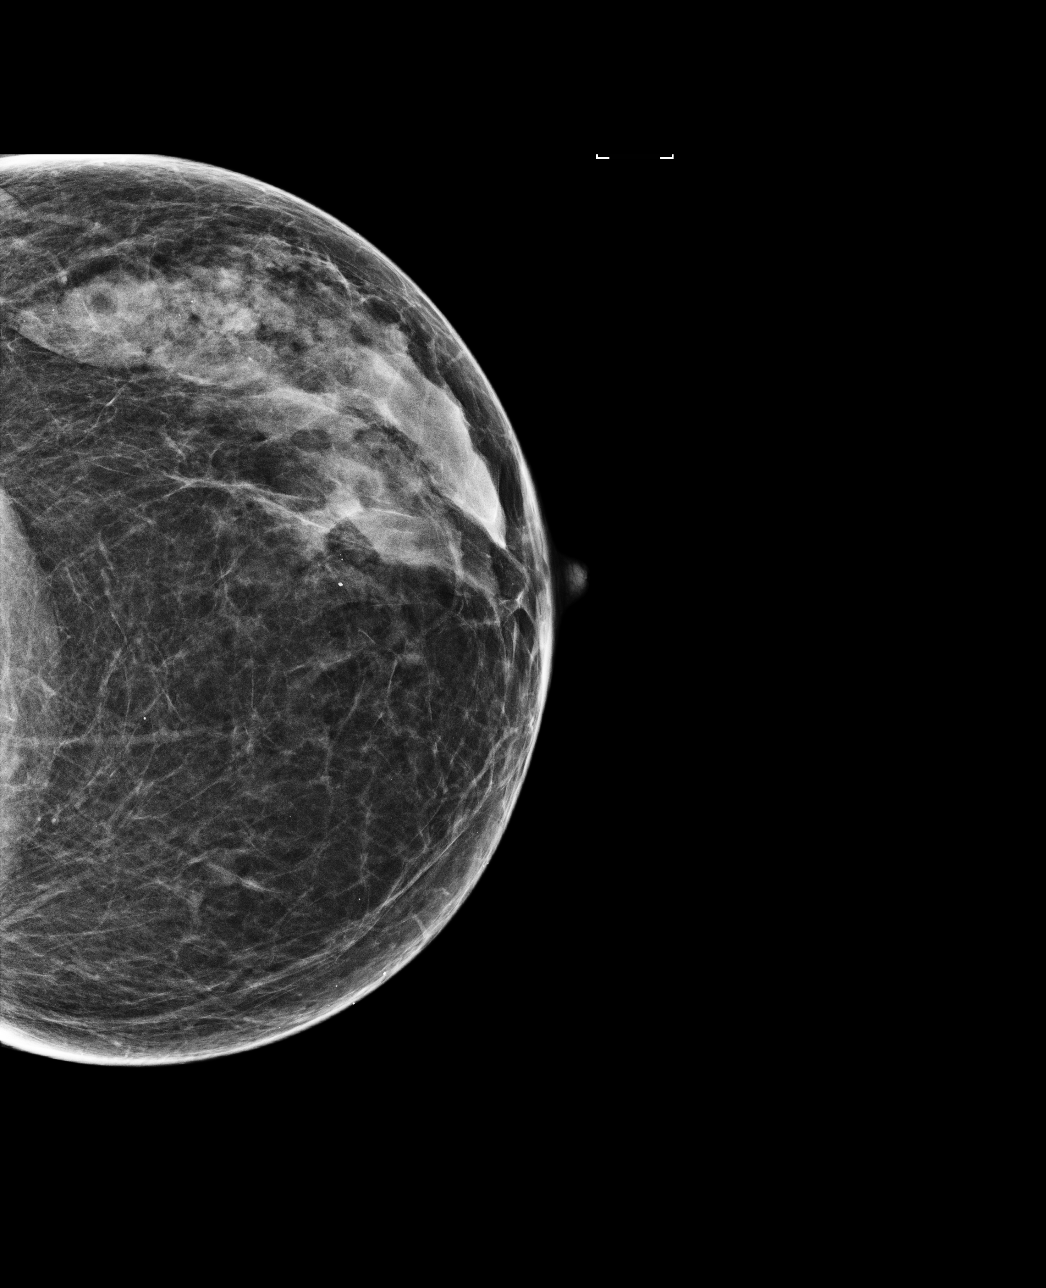

[4 of 4 positions shown; findings below may reference images not displayed]

ACR Breast Density Category c: The breast tissue is heterogeneously
dense, which may obscure small masses.
FINDINGS: There are no findings suspicious for malignancy. Images were
processed with CAD.
IMPRESSION: No mammographic evidence of malignancy. A result letter of this
screening mammogram will be mailed directly to the patient.

RECOMMENDATION:
Screening mammogram in one year. (Code:[id number])

BI-RADS CATEGORY  1: Negative.

## 2021-02-01 ENCOUNTER — Other Ambulatory Visit: Payer: Self-pay | Admitting: Family Medicine

## 2021-02-01 DIAGNOSIS — Z1231 Encounter for screening mammogram for malignant neoplasm of breast: Secondary | ICD-10-CM

## 2021-03-21 ENCOUNTER — Ambulatory Visit: Payer: Self-pay | Admitting: Physical Therapy

## 2022-01-27 ENCOUNTER — Other Ambulatory Visit: Payer: Self-pay | Admitting: Family Medicine

## 2022-01-27 DIAGNOSIS — Z1231 Encounter for screening mammogram for malignant neoplasm of breast: Secondary | ICD-10-CM
